# Patient Record
Sex: Male | Born: 1965 | Race: White | Hispanic: No | Marital: Married | Smoking: Never smoker
Health system: Southern US, Community
[De-identification: ages and names within clinical notes are randomized; demographics above are authoritative.]

## PROBLEM LIST (undated history)

## (undated) DIAGNOSIS — G8929 Other chronic pain: Secondary | ICD-10-CM

## (undated) DIAGNOSIS — R51 Headache: Secondary | ICD-10-CM

## (undated) DIAGNOSIS — R519 Headache, unspecified: Secondary | ICD-10-CM

## (undated) DIAGNOSIS — M722 Plantar fascial fibromatosis: Secondary | ICD-10-CM

## (undated) DIAGNOSIS — K760 Fatty (change of) liver, not elsewhere classified: Secondary | ICD-10-CM

## (undated) DIAGNOSIS — E785 Hyperlipidemia, unspecified: Secondary | ICD-10-CM

## (undated) DIAGNOSIS — M5030 Other cervical disc degeneration, unspecified cervical region: Secondary | ICD-10-CM

## (undated) DIAGNOSIS — R195 Other fecal abnormalities: Secondary | ICD-10-CM

## (undated) DIAGNOSIS — M51361 Other intervertebral disc degeneration, lumbar region with lower extremity pain only: Secondary | ICD-10-CM

## (undated) DIAGNOSIS — R270 Ataxia, unspecified: Secondary | ICD-10-CM

## (undated) HISTORY — DX: Other intervertebral disc degeneration, lumbar region with lower extremity pain only: M51.361

## (undated) HISTORY — DX: Headache: R51

## (undated) HISTORY — DX: Ataxia, unspecified: R27.0

## (undated) HISTORY — DX: Plantar fascial fibromatosis: M72.2

## (undated) HISTORY — DX: Fatty (change of) liver, not elsewhere classified: K76.0

## (undated) HISTORY — DX: Hyperlipidemia, unspecified: E78.5

## (undated) HISTORY — DX: Other chronic pain: G89.29

## (undated) HISTORY — DX: Other fecal abnormalities: R19.5

## (undated) HISTORY — DX: Headache, unspecified: R51.9

## (undated) HISTORY — DX: Other cervical disc degeneration, unspecified cervical region: M50.30

---

## 2006-06-18 ENCOUNTER — Emergency Department: Payer: Self-pay | Admitting: Emergency Medicine

## 2008-03-05 ENCOUNTER — Encounter: Payer: Self-pay | Admitting: Internal Medicine

## 2008-05-08 ENCOUNTER — Ambulatory Visit: Payer: Self-pay | Admitting: Internal Medicine

## 2008-05-08 DIAGNOSIS — K625 Hemorrhage of anus and rectum: Secondary | ICD-10-CM | POA: Insufficient documentation

## 2008-05-08 DIAGNOSIS — R1084 Generalized abdominal pain: Secondary | ICD-10-CM

## 2008-05-08 DIAGNOSIS — R198 Other specified symptoms and signs involving the digestive system and abdomen: Secondary | ICD-10-CM

## 2008-06-25 ENCOUNTER — Ambulatory Visit: Payer: Self-pay | Admitting: Internal Medicine

## 2008-10-14 ENCOUNTER — Ambulatory Visit: Payer: Self-pay | Admitting: Otolaryngology

## 2012-08-03 ENCOUNTER — Ambulatory Visit
Admission: RE | Admit: 2012-08-03 | Discharge: 2012-08-03 | Disposition: A | Discharge status: Home / Self Care | Payer: No Typology Code available for payment source | Source: Ambulatory Visit | Attending: Family Medicine | Admitting: Family Medicine

## 2012-08-03 ENCOUNTER — Other Ambulatory Visit: Payer: Self-pay | Admitting: Family Medicine

## 2012-08-03 DIAGNOSIS — Z021 Encounter for pre-employment examination: Secondary | ICD-10-CM

## 2015-07-23 ENCOUNTER — Encounter: Payer: Self-pay | Admitting: Internal Medicine

## 2018-02-13 ENCOUNTER — Encounter: Payer: Self-pay | Admitting: Physician Assistant

## 2018-02-23 ENCOUNTER — Ambulatory Visit: Payer: Self-pay | Admitting: Physician Assistant

## 2018-02-24 ENCOUNTER — Ambulatory Visit (INDEPENDENT_AMBULATORY_CARE_PROVIDER_SITE_OTHER): Payer: 59 | Admitting: Physician Assistant

## 2018-02-24 ENCOUNTER — Encounter: Payer: Self-pay | Admitting: Physician Assistant

## 2018-02-24 VITALS — BP 130/82 | HR 79 | Ht 71.0 in | Wt 179.0 lb

## 2018-02-24 DIAGNOSIS — R195 Other fecal abnormalities: Secondary | ICD-10-CM

## 2018-02-24 MED ORDER — PEG-KCL-NACL-NASULF-NA ASC-C 140 G PO SOLR: 1.0000 | ORAL | 0 refills | Status: DC

## 2018-02-24 NOTE — Progress Notes (Signed)
Chief Complaint: Hemoccult positive stools  HPI:    Brady Alexander is a 52 year old Caucasian male with a past medical history as listed below, who was referred to me by Burnard Bunting, MD for a complaint of occult positive stools.      Last colonoscopy 06/25/08 with Dr. Henrene Pastor.  Normal.  Repeat recommended in 10 years.    Office visit with PCP 01/11/18 for annual physical.  CBC, CMP normal.  Hemoccult positive.   Today, explains he has felt well recently.  Was taking a daily baby aspirin "per my own doing" and read that this could cause some GI bleeding, so stopped this over the past 5 days.  Tells me he has noticed a slight lightening of his stools, though denies black stool prior to this.  Has never noticed any bright red blood in his stool.  No change in bowel habits other than "maybe a little looser over the past month or two depending on what I eat".    Denies fever, chills, weight loss, anorexia, nausea, vomiting, heartburn, reflux, abdominal pain, rectal pain or family history of colon cancer.  Past Medical History:  Diagnosis Date  . Ataxia   . Chronic headaches   . Fatty liver     Current Outpatient Medications  Medication Sig Dispense Refill  . b complex vitamins tablet Take 1 tablet by mouth daily.    . Cyanocobalamin (VITAMIN B-12 PO) Take by mouth.    . Magnesium 250 MG TABS Take by mouth.    . Multiple Vitamins-Minerals (MULTIVITAMIN MEN PO) Take by mouth.    Marland Kitchen OVER THE COUNTER MEDICATION Tumeric once daily    . vitamin C (ASCORBIC ACID) 500 MG tablet Take 500 mg by mouth daily.    Marland Kitchen aspirin EC 81 MG tablet Take 81 mg by mouth daily.    Marland Kitchen PEG-KCl-NaCl-NaSulf-Na Asc-C (PLENVU) 140 g SOLR Take 1 kit by mouth as directed. 1 each 0   No current facility-administered medications for this visit.     Allergies as of 02/24/2018  . (No Known Allergies)    Family History  Problem Relation Age of Onset  . Diabetes Mother   . Hypertension Brother   . Obesity Brother   .  Alcoholism Brother     Social History   Socioeconomic History  . Marital status: Married    Spouse name: Not on file  . Number of children: Not on file  . Years of education: Not on file  . Highest education level: Not on file  Social Needs  . Financial resource strain: Not on file  . Food insecurity - worry: Not on file  . Food insecurity - inability: Not on file  . Transportation needs - medical: Not on file  . Transportation needs - non-medical: Not on file  Occupational History  . Occupation: Geophysicist/field seismologist  Tobacco Use  . Smoking status: Never Smoker  . Smokeless tobacco: Never Used  Substance and Sexual Activity  . Alcohol use: Yes  . Drug use: Not on file  . Sexual activity: Not on file  Other Topics Concern  . Not on file  Social History Narrative  . Not on file    Review of Systems:    Constitutional: No weight loss, fever or chills Skin: No rash  Cardiovascular: No chest pain Respiratory: No SOB Gastrointestinal: See HPI and otherwise negative Genitourinary: No dysuria  Neurological: No headache, dizziness or syncope Musculoskeletal: No new muscle or joint pain Hematologic: No bruising Psychiatric: No history  of depression or anxiety   Physical Exam:  Vital signs: BP 130/82   Pulse 79   Ht _0  (1.803 m)   Wt 179 lb (81.2 kg)   SpO2 99%   BMI 24.97 kg/m   Constitutional:   Pleasant Caucasian male appears to be in NAD, Well developed, Well nourished, alert and cooperative Head:  Normocephalic and atraumatic. Eyes:   PEERL, EOMI. No icterus. Conjunctiva pink. Ears:  Normal auditory acuity. Neck:  Supple Throat: Oral cavity and pharynx without inflammation, swelling or lesion.  Respiratory: Respirations even and unlabored. Lungs clear to auscultation bilaterally.   No wheezes, crackles, or rhonchi.  Cardiovascular: Normal S1, S2. No MRG. Regular rate and rhythm. No peripheral edema, cyanosis or pallor.  Gastrointestinal:  Soft, nondistended,  nontender. No rebound or guarding. Normal bowel sounds. No appreciable masses or hepatomegaly. Rectal:  Not performed.  Msk:  Symmetrical without gross deformities. Without edema, no deformity or joint abnormality.  Neurologic:  Alert and  oriented x4;  grossly normal neurologically.  Skin:   Dry and intact without significant lesions or rashes. Psychiatric: Demonstrates good judgement and reason without abnormal affect or behaviors.  See HPI for recent labs.  Assessment: 1.  Hemoccult positive stools: In the past 6 weeks 2 Hemoccult positive tests, patient has not been seeing any blood in his stools, no change in bowel habits or weight loss, last colonoscopy 10 years ago was normal; consider hemorrhoids versus polyp versus other  Plan: 1.  Scheduled patient for a diagnostic colonoscopy due to Hemoccult-positive stools with Dr. Henrene Pastor in [facility].  Did discuss risk, benefits, limitations and alternatives and patient agrees to proceed. 2.  Patient to follow in clinic per recommendations from Dr. Henrene Pastor after time of procedure.  Ellouise Newer, PA-C [facility] 02/24/2018, 9:46 AM  Cc: Burnard Bunting, MD

## 2018-02-24 NOTE — Patient Instructions (Signed)
If you are age 50 or older, your body mass index should be between 23-30. Your Body mass index is 24.97 kg/m. If this is out of the aforementioned range listed, please consider follow up with your Primary Care Provider.  If you are age 50 or younger, your body mass index should be between 19-25. Your Body mass index is 24.97 kg/m. If this is out of the aformentioned range listed, please consider follow up with your Primary Care Provider.   You have been scheduled for a colonoscopy. Please follow written instructions given to you at your visit today.  Please pick up your prep supplies at the pharmacy within the next 1-3 days. If you use inhalers (even only as needed), please bring them with you on the day of your procedure. Your physician has requested that you go to www.startemmi.com and enter the access code given to you at your visit today. This web site gives a general overview about your procedure. However, you should still follow specific instructions given to you by our office regarding your preparation for the procedure.  We have sent the following medications to your pharmacy for you to pick up at your convenience: Plenvu  Thank you for choosing me and [facility].  Hyacinth MeekerJennifer Lemmon, PA-C

## 2018-02-27 NOTE — Progress Notes (Signed)
Agree with assessment and plans 

## 2018-04-20 ENCOUNTER — Encounter: Payer: 59 | Admitting: Internal Medicine

## 2018-04-27 ENCOUNTER — Other Ambulatory Visit: Payer: Self-pay

## 2018-04-27 ENCOUNTER — Encounter: Payer: 59 | Admitting: Internal Medicine

## 2018-04-27 ENCOUNTER — Ambulatory Visit (AMBULATORY_SURGERY_CENTER): Payer: 59 | Admitting: Internal Medicine

## 2018-04-27 ENCOUNTER — Encounter: Payer: Self-pay | Admitting: Internal Medicine

## 2018-04-27 VITALS — BP 104/58 | HR 63 | Temp 98.6°F | Resp 17 | Ht 71.0 in | Wt 179.0 lb

## 2018-04-27 DIAGNOSIS — R195 Other fecal abnormalities: Secondary | ICD-10-CM | POA: Diagnosis present

## 2018-04-27 MED ORDER — SODIUM CHLORIDE 0.9 % IV SOLN: 500.0000 mL | Freq: Once | INTRAVENOUS | Status: AC

## 2018-04-27 NOTE — Progress Notes (Signed)
Report to PACU, RN, vss, BBS= Clear.  

## 2018-04-27 NOTE — Op Note (Signed)
[facility] Patient Name: Brady Alexander Procedure Date: 04/27/2018 3:03 PM MRN: [id number] Endoscopist: Wilhemina Bonito. Marina Goodell , MD Age: 52 Referring MD:  Date of Birth: 05/09/1966 Gender: Male Account #: [id number] Procedure:                Colonoscopy Indications:              Heme positive stool Medicines:                Monitored Anesthesia Care Procedure:                Pre-Anesthesia Assessment:                           - Prior to the procedure, a History and Physical                            was performed, and patient medications and                            allergies were reviewed. The patient's tolerance of                            previous anesthesia was also reviewed. The risks                            and benefits of the procedure and the sedation                            options and risks were discussed with the patient.                            All questions were answered, and informed consent                            was obtained. Prior Anticoagulants: The patient has                            taken no previous anticoagulant or antiplatelet                            agents. ASA Grade Assessment: I - A normal, healthy                            patient. After reviewing the risks and benefits,                            the patient was deemed in satisfactory condition to                            undergo the procedure.                           After obtaining informed consent, the colonoscope  was passed under direct vision. Throughout the                            procedure, the patient's blood pressure, pulse, and                            oxygen saturations were monitored continuously. The                            Model CF-HQ190L [id number]) scope was introduced                            through the anus and advanced to the the cecum,                            identified by appendiceal orifice and ileocecal                   valve. The ileocecal valve, appendiceal orifice,                            and rectum were photographed. The quality of the                            bowel preparation was excellent. The colonoscopy                            was performed without difficulty. The patient                            tolerated the procedure well. The bowel preparation                            used was SUPREP. Scope In: 3:24:17 PM Scope Out: 3:41:34 PM Scope Withdrawal Time: 0 hours 14 minutes 46 seconds  Total Procedure Duration: 0 hours 17 minutes 17 seconds  Findings:                 The entire examined colon appeared normal on direct                            and retroflexion views. Complications:            No immediate complications. Estimated blood loss:                            None. Estimated Blood Loss:     Estimated blood loss: none. Impression:               - The entire examined colon is normal on direct and                            retroflexion views.                           - No specimens collected. Recommendation:           -  Repeat colonoscopy in 10 years for screening                            purposes.                           - Patient has a contact number available for                            emergencies. The signs and symptoms of potential                            delayed complications were discussed with the                            patient. Return to normal activities tomorrow.                            Written discharge instructions were provided to the                            patient.                           - Resume previous diet.                           - Continue present medications. Wilhemina Bonito. Marina Goodell, MD 04/27/2018 3:47:01 PM This report has been signed electronically.

## 2018-04-27 NOTE — Progress Notes (Signed)
Pt's states no medical or surgical changes since previsit or office visit. 

## 2018-04-27 NOTE — Patient Instructions (Signed)
YOU HAD AN ENDOSCOPIC PROCEDURE TODAY AT THE [facility]:   Refer to the procedure report that was given to you for any specific questions about what was found during the examination.  If the procedure report does not answer your questions, please call your gastroenterologist to clarify.  If you requested that your care partner not be given the details of your procedure findings, then the procedure report has been included in a sealed envelope for you to review at your convenience later.  YOU SHOULD EXPECT: Some feelings of bloating in the abdomen. Passage of more gas than usual.  Walking can help get rid of the air that was put into your GI tract during the procedure and reduce the bloating. If you had a lower endoscopy (such as a colonoscopy or flexible sigmoidoscopy) you may notice spotting of blood in your stool or on the toilet paper. If you underwent a bowel prep for your procedure, you may not have a normal bowel movement for a few days.  Please Note:  You might notice some irritation and congestion in your nose or some drainage.  This is from the oxygen used during your procedure.  There is no need for concern and it should clear up in a day or so.  SYMPTOMS TO REPORT IMMEDIATELY:   Following lower endoscopy (colonoscopy or flexible sigmoidoscopy):  Excessive amounts of blood in the stool  Significant tenderness or worsening of abdominal pains  Swelling of the abdomen that is new, acute  Fever of 100F or higher   For urgent or emergent issues, a gastroenterologist can be reached at any hour by calling [phone].   DIET:  We do recommend a small meal at first, but then you may proceed to your regular diet.  Drink plenty of fluids but you should avoid alcoholic beverages for 24 hours.  ACTIVITY:  You should plan to take it easy for the rest of today and you should NOT DRIVE or use heavy machinery until tomorrow (because of the sedation medicines used during the test).     FOLLOW UP: Our staff will call the number listed on your records the next business day following your procedure to check on you and address any questions or concerns that you may have regarding the information given to you following your procedure. If we do not reach you, we will leave a message.  However, if you are feeling well and you are not experiencing any problems, there is no need to return our call.  We will assume that you have returned to your regular daily activities without incident.  If any biopsies were taken you will be contacted by phone or by letter within the next 1-3 weeks.  Please call us at [phone] if you have not heard about the biopsies in 3 weeks.    SIGNATURES/CONFIDENTIALITY: You and/or your care partner have signed paperwork which will be entered into your electronic medical record.  These signatures attest to the fact that that the information above on your After Visit Summary has been reviewed and is understood.  Full responsibility of the confidentiality of this discharge information lies with you and/or your care-partner.   Thank you for allowing us to provide your healthcare today.  

## 2018-04-28 ENCOUNTER — Telehealth: Payer: Self-pay | Admitting: *Deleted

## 2018-04-28 NOTE — Telephone Encounter (Signed)
  Follow up Call-  Call back number 04/27/2018  Post procedure Call Back phone  # [phone]  Permission to leave phone message Yes  Some recent data might be hidden     Patient questions:  Message left to call us if necessary.

## 2018-04-28 NOTE — Telephone Encounter (Signed)
No answer, message left for the patient. 

## 2019-06-14 ENCOUNTER — Encounter (HOSPITAL_COMMUNITY): Payer: 59

## 2019-09-05 ENCOUNTER — Other Ambulatory Visit: Payer: Self-pay

## 2019-09-05 DIAGNOSIS — Z20822 Contact with and (suspected) exposure to covid-19: Secondary | ICD-10-CM

## 2019-09-06 LAB — NOVEL CORONAVIRUS, NAA: SARS-CoV-2, NAA: NOT DETECTED

## 2019-09-11 ENCOUNTER — Other Ambulatory Visit: Payer: Self-pay

## 2019-09-11 DIAGNOSIS — Z20822 Contact with and (suspected) exposure to covid-19: Secondary | ICD-10-CM

## 2019-09-12 LAB — NOVEL CORONAVIRUS, NAA: SARS-CoV-2, NAA: NOT DETECTED

## 2019-10-18 ENCOUNTER — Other Ambulatory Visit: Payer: Self-pay

## 2019-10-18 DIAGNOSIS — Z20822 Contact with and (suspected) exposure to covid-19: Secondary | ICD-10-CM

## 2019-10-20 LAB — NOVEL CORONAVIRUS, NAA: SARS-CoV-2, NAA: NOT DETECTED

## 2019-12-29 ENCOUNTER — Other Ambulatory Visit: Payer: Self-pay

## 2019-12-29 ENCOUNTER — Encounter (HOSPITAL_COMMUNITY): Payer: Self-pay

## 2019-12-29 ENCOUNTER — Emergency Department (HOSPITAL_COMMUNITY)
Admission: EM | Admit: 2019-12-29 | Discharge: 2019-12-29 | Disposition: A | Discharge status: Home / Self Care | Payer: 59 | Attending: Emergency Medicine | Admitting: Emergency Medicine

## 2019-12-29 DIAGNOSIS — Y939 Activity, unspecified: Secondary | ICD-10-CM | POA: Diagnosis not present

## 2019-12-29 DIAGNOSIS — S91312A Laceration without foreign body, left foot, initial encounter: Secondary | ICD-10-CM | POA: Diagnosis not present

## 2019-12-29 DIAGNOSIS — Z23 Encounter for immunization: Secondary | ICD-10-CM | POA: Diagnosis not present

## 2019-12-29 DIAGNOSIS — Y288XXA Contact with other sharp object, undetermined intent, initial encounter: Secondary | ICD-10-CM | POA: Insufficient documentation

## 2019-12-29 DIAGNOSIS — Y999 Unspecified external cause status: Secondary | ICD-10-CM | POA: Diagnosis not present

## 2019-12-29 DIAGNOSIS — Y929 Unspecified place or not applicable: Secondary | ICD-10-CM | POA: Insufficient documentation

## 2019-12-29 MED ORDER — TETANUS-DIPHTH-ACELL PERTUSSIS 5-2.5-18.5 LF-MCG/0.5 IM SUSP
0.5000 mL | Freq: Once | INTRAMUSCULAR | Status: AC
  Administered 2019-12-29: 0.5 mL via INTRAMUSCULAR
  Filled 2019-12-29: qty 0.5

## 2019-12-29 MED ORDER — LIDOCAINE HCL (PF) 1 % IJ SOLN
5.0000 mL | Freq: Once | INTRAMUSCULAR | Status: AC
  Administered 2019-12-29: 5 mL via INTRADERMAL
  Filled 2019-12-29: qty 5

## 2019-12-29 NOTE — ED Notes (Signed)
Dried blood cleaned off the foot, nonstick gauze covered the stitches & then it was covered with a tegaderm.

## 2019-12-29 NOTE — Discharge Instructions (Addendum)
Have your stitches removed by a doctor in 10 days

## 2019-12-29 NOTE — ED Triage Notes (Signed)
Pt was jogging [place] steps [place] bridge & his Left heel caught against the wood on the step behind him & caused an approx. 5.5 cm long laceration.

## 2019-12-29 NOTE — ED Provider Notes (Signed)
[facility] Provider Note   CSN: [id number] Arrival date & time: 12/29/19  1403     History Chief Complaint  Patient presents with  . Foot Pain    Lequan Dobratz Corpuz is a 54 y.o. male.  HPI AMELIA MACKEN is a 54 y.o. male with a medical history of headache, fatty liver who presents to the ED for left ankle laceration. Injury occurred approximately two hours ago, he was running [place] steps on a bridge wearing sandles, his left heel caught the step behind him and sustained a 6 cm laceration to the back of his left heel. Wound is hemostatic. He denies any other injuries or pain. He has full strength in dorsi and plantar flexion of his left foot. No numbness of his foot.      Past Medical History:  Diagnosis Date  . Ataxia   . Chronic headaches   . Fatty liver     Patient Active Problem List   Diagnosis Date Noted  . RECTAL BLEEDING 05/08/2008  . CHANGE IN BOWELS 05/08/2008  . ABDOMINAL PAIN, GENERALIZED 05/08/2008    History reviewed. No pertinent surgical history.     Family History  Problem Relation Age of Onset  . Diabetes Mother   . Hypertension Brother   . Obesity Brother   . Alcoholism Brother   . Colon cancer Neg Hx   . Rectal cancer Neg Hx     Social History   Tobacco Use  . Smoking status: Never Smoker  . Smokeless tobacco: Never Used  Substance Use Topics  . Alcohol use: Yes  . Drug use: Never    Home Medications Prior to Admission medications   Medication Sig Start Date End Date Taking? Authorizing Provider  acetaminophen (TYLENOL) 325 MG tablet Take 325-650 mg by mouth every 6 (six) hours as needed for headache (pain).   Yes [provider]  Amino Acids (AMINO ACID PO) Take 1 tablet by mouth daily.   Yes [provider]  Ascorbic Acid (VITAMIN C) 1000 MG tablet Take 1,000 mg by mouth daily.    Yes [provider]  b complex vitamins tablet Take 1 tablet by mouth daily.   Yes [provider]  Magnesium 500 MG TABS Take 500 mg by mouth daily.    Yes [provider]  Multiple Vitamin (MULTIVITAMIN WITH MINERALS) TABS tablet Take 1 tablet by mouth daily.   Yes [provider]  Nicotinamide Riboside Chloride POWD Take 1 capsule by mouth daily.   Yes [provider]  Omega-3 Fatty Acids (FISH OIL PO) Take 1 capsule by mouth daily.   Yes [provider]  RESVERATROL PO Take 1 tablet by mouth daily.   Yes [provider]  TURMERIC PO Take 1 capsule by mouth daily.   Yes [provider]  vitamin B-12 (CYANOCOBALAMIN) 1000 MCG tablet Take 1,000 mcg by mouth daily.    Yes [provider]    Allergies    Patient has no known allergies.  Review of Systems   Review of Systems  Constitutional: Negative for chills and fever.  HENT: Negative for congestion, ear pain and sore throat.   Eyes: Negative for pain, discharge, redness and visual disturbance.  Respiratory: Negative for cough and shortness of breath.   Cardiovascular: Negative for chest pain and palpitations.  Gastrointestinal: Negative for abdominal pain, diarrhea, nausea and vomiting.  Genitourinary: Negative for dysuria, frequency and hematuria.  Musculoskeletal: Negative for arthralgias, back pain  and neck stiffness.  Skin: Positive for wound (Laceration to posterior left heel). Negative for color change and rash.  Neurological: Negative for seizures, syncope and weakness.  Psychiatric/Behavioral: Negative for agitation.  All other systems reviewed and are negative.   Physical Exam Updated Vital Signs BP 125/82 (BP Location: Right Arm)   Pulse 72   Temp 98.3 F (36.8 C) (Oral)   Resp 18   Ht 5\' 10"  (1.778 m)   Wt 79.4 kg   SpO2 100%   BMI 25.11 kg/m   Physical Exam Vitals and nursing note reviewed.  Constitutional:      General: He is not in acute distress.    Appearance: Normal appearance. He is well-developed. He is not  ill-appearing.  HENT:     Head: Normocephalic and atraumatic.     Right Ear: External ear normal.     Left Ear: External ear normal.     Nose: Nose normal. No congestion.     Mouth/Throat:     Mouth: Mucous membranes are moist.  Eyes:     General:        Right eye: No discharge.        Left eye: No discharge.     Conjunctiva/sclera: Conjunctivae normal.  Cardiovascular:     Rate and Rhythm: Normal rate and regular rhythm.     Pulses: Normal pulses.     Heart sounds: Normal heart sounds. No murmur.  Pulmonary:     Effort: Pulmonary effort is normal. No respiratory distress.     Breath sounds: Normal breath sounds. No wheezing or rales.  Abdominal:     General: Abdomen is flat. There is no distension.     Palpations: Abdomen is soft.     Tenderness: There is no abdominal tenderness.  Musculoskeletal:        General: No signs of injury. Normal range of motion.     Cervical back: Normal range of motion and neck supple.  Skin:    General: Skin is warm and dry.     Capillary Refill: Capillary refill takes less than 2 seconds.     Findings: Erythema and lesion present.     Comments: 6 cm linear laceration to posterior left heel at insertion site of his achilles tendon. Wound is hemostatic. DP pulses 2+ bilaterally, sensation intact to light touch. He has full strength of dorsi and plantar flexion, achilles tendon is intact  Neurological:     General: No focal deficit present.     Mental Status: He is alert. Mental status is at baseline.  Psychiatric:        Mood and Affect: Mood normal.        Behavior: Behavior normal.     ED Results / Procedures / Treatments   Labs (all labs ordered are listed, but only abnormal results are displayed) Labs Reviewed - No data to display  EKG None  Radiology No results found.  Procedures . Laceration Repair  Date/Time: 12/29/2019 3:52 PM Performed by: 02/26/2020, MD Authorized by: Manuela Schwartz, MD   Consent:    Consent  obtained:  Verbal   Consent given by:  Patient   Risks discussed:  Infection and pain   Alternatives discussed:  No treatment Universal protocol:    Patient identity confirmed:  Verbally with patient and arm band Anesthesia (see MAR for exact dosages):    Anesthesia method:  Local infiltration   Local anesthetic:  Lidocaine 1% w/o epi Laceration details:    Location:  Foot   Foot location:  L heel   Length (cm):  6 Repair type:    Repair type:  Simple Pre-procedure details:    Preparation:  Patient was prepped and draped in usual sterile fashion Exploration:    Wound exploration: entire depth of wound probed and visualized     Wound extent: no foreign bodies/material noted, no nerve damage noted and no tendon damage noted     Contaminated: no   Treatment:    Area cleansed with:  Saline   Amount of cleaning:  Extensive   Irrigation solution:  Sterile saline   Irrigation volume:  1000 mL   Irrigation method:  Pressure wash   Visualized foreign bodies/material removed: no   Skin repair:    Repair method:  Sutures   Suture size:  3-0   Suture material:  Nylon   Suture technique:  Horizontal mattress   Number of sutures:  6 Approximation:    Approximation:  Close Post-procedure details:    Dressing:  Non-adherent dressing   Patient tolerance of procedure:  Tolerated well, no immediate complications   (including critical care time)  Medications Ordered in ED Medications  lidocaine (PF) (XYLOCAINE) 1 % injection 5 mL (5 mLs Intradermal Given 12/29/19 1525)  Tdap (BOOSTRIX) injection 0.5 mL (0.5 mLs Intramuscular Given 12/29/19 1520)    ED Course  I have reviewed the triage vital signs and the nursing notes.  Pertinent labs & imaging results that were available during my care of the patient were reviewed by me and considered in my medical decision making (see chart for details).    MDM Rules/Calculators/A&P                      Eulalio Reamy Bachmann is a 54 y.o. male with a  medical history of headache, fatty liver who presents to the ED for left ankle laceration. Injury occurred approximately two hours ago, he was running [place] bridge wearing sandles, his left heel caught the step behind him and sustained a 6 cm laceration to the back of his left heel. Wound is hemostatic. He denies any other injuries or pain. He has full strength in dorsi and plantar flexion of his left foot. No numbness of his foot.  HPI and physical exam as above. He presents awake, alert, hemodynamically stable, afebrile, non toxic. 6 cm linear laceration to posterior left heel at insertion site of his achilles tendon. Wound is hemostatic. DP pulses 2+ bilaterally, sensation intact to light touch. He has full strength of dorsi and plantar flexion, achilles tendon is intact  See procedure note, repaired by primary intention without complication. Tdap updated. Wound care discussed and instructed for suture removal in ten days by a provider. Discussed plan with patient and family including strict return precautions and follow up with PCP. Patient and family understand and are amenable with plan.   Final Clinical Impression(s) / ED Diagnoses Final diagnoses:  Laceration of left foot, initial encounter    Rx / DC Orders ED Discharge Orders    None       Mija Effertz, Ladona Ridgel, MD 12/29/19 1840    Little, Ambrose Finland, MD 12/29/19 1952

## 2019-12-29 NOTE — ED Notes (Signed)
MD at bedside numbing pt's foot.

## 2020-02-12 ENCOUNTER — Ambulatory Visit: Payer: 59 | Attending: Internal Medicine

## 2020-02-12 DIAGNOSIS — Z20822 Contact with and (suspected) exposure to covid-19: Secondary | ICD-10-CM

## 2020-02-13 LAB — NOVEL CORONAVIRUS, NAA: SARS-CoV-2, NAA: NOT DETECTED

## 2020-11-20 ENCOUNTER — Other Ambulatory Visit: Payer: Self-pay

## 2020-11-20 ENCOUNTER — Other Ambulatory Visit
Admission: RE | Admit: 2020-11-20 | Discharge: 2020-11-20 | Disposition: A | Discharge status: Home / Self Care | Payer: 59 | Source: Ambulatory Visit | Attending: Orthopedic Surgery | Admitting: Orthopedic Surgery

## 2020-11-20 ENCOUNTER — Encounter (HOSPITAL_COMMUNITY): Payer: Self-pay | Admitting: Orthopedic Surgery

## 2020-11-20 DIAGNOSIS — Z01812 Encounter for preprocedural laboratory examination: Secondary | ICD-10-CM | POA: Insufficient documentation

## 2020-11-20 DIAGNOSIS — Z20822 Contact with and (suspected) exposure to covid-19: Secondary | ICD-10-CM | POA: Diagnosis not present

## 2020-11-20 LAB — SARS CORONAVIRUS 2 (TAT 6-24 HRS): SARS Coronavirus 2: NEGATIVE

## 2020-11-20 NOTE — Progress Notes (Signed)
PCP - Dr. Geoffry Paradise  Cardiologist - denies  Chest x-ray - n/a EKG - n/a Stress Test - denies ECHO - deneis Cardiac Cath - denies  ERAS Protcol - clears 0630  COVID TEST- 11/20/20  Coronavirus Screening  Have you experienced the following symptoms:  Cough yes/no: No Fever (>100.38F)  yes/no: No Runny nose yes/no: No Sore throat yes/no: No Difficulty breathing/shortness of breath  yes/no: No  Have you or a family member traveled in the last 14 days and where? yes/no: No  Anesthesia review: n/a   -------------  SDW INSTRUCTIONS:  Your procedure is scheduled on Saturday 11/22/20.  Report to [facility] Emergency Department at 0700 A.M., and check in.  Call this number if you have problems the morning of surgery: [phone]   Remember: Do not eat after midnight the night before your surgery  You may drink clear liquids until 06:30 AM the morning of your surgery.   Clear liquids allowed are: Water, Non-Citrus Juices (without pulp), Carbonated Beverages, Clear Tea, Black Coffee Only, and Gatorade   No medications needed morning of surgery.  Per Dr. Amanda Pea - no abx d/t cx interop.  As of today, STOP taking any Aspirin (unless otherwise instructed by your surgeon), Aleve, Naproxen, Ibuprofen, Motrin, Advil, Goody's, BC's, all herbal medications, fish oil, and all vitamins.    The Morning of Surgery  Do not wear jewelry  Do not wear lotions, powders, colognes, or deodorant Men may shave face and neck.  Do not bring valuables to the hospital.  [facility] is not responsible for any belongings or valuables.  If you are a smoker, DO NOT Smoke 24 hours prior to surgery  If you wear a CPAP at night please bring your mask the morning of surgery   Remember that you must have someone to transport you home after your surgery, and remain with you for 24 hours if you are discharged the same day.  Please bring cases for contacts, glasses, hearing aids, dentures or  bridgework because it cannot be worn into surgery.   Leave your suitcase in the car.  After surgery it may be brought to your room.  For patients admitted to the hospital, discharge time will be determined by your treatment team.  Patients discharged the day of surgery will not be allowed to drive home.    Special instructions:   [facility]- Preparing For Surgery  Oral Hygiene is also important to reduce your risk of infection.  Remember - BRUSH YOUR TEETH THE MORNING OF SURGERY WITH YOUR REGULAR TOOTHPASTE  Please follow these instructions carefully.   1. Shower the NIGHT BEFORE SURGERY and the MORNING OF SURGERY with DIAL Soap.   2. Wash thoroughly, paying special attention to the area where your surgery will be performed.  3. Thoroughly rinse your body with warm water from the neck down.  4. Pat yourself dry with a CLEAN TOWEL.  5. Wear CLEAN PAJAMAS to bed the night before surgery  6. Place CLEAN SHEETS on your bed the night of your first shower and DO NOT SLEEP WITH PETS.  7. Wear comfortable clothes the morning of surgery.    Day of Surgery:  Please shower the morning of surgery with the DIAL soap Do not apply any deodorants/lotions. Please wear clean clothes to the hospital/surgery center.   Remember to brush your teeth WITH YOUR REGULAR TOOTHPASTE.   Please read over the following fact sheets that you were given.  Patient denies shortness of breath, fever,  cough and chest pain.

## 2020-11-22 ENCOUNTER — Ambulatory Visit (HOSPITAL_COMMUNITY): Payer: 59 | Admitting: Anesthesiology

## 2020-11-22 ENCOUNTER — Ambulatory Visit (HOSPITAL_COMMUNITY)
Admission: RE | Admit: 2020-11-22 | Discharge: 2020-11-22 | Disposition: A | Discharge status: Home / Self Care | Payer: 59 | Attending: Orthopedic Surgery | Admitting: Orthopedic Surgery

## 2020-11-22 ENCOUNTER — Encounter (HOSPITAL_COMMUNITY)
Admission: RE | Disposition: A | Discharge status: Home / Self Care | Payer: Self-pay | Source: Home / Self Care | Attending: Orthopedic Surgery

## 2020-11-22 ENCOUNTER — Encounter (HOSPITAL_COMMUNITY): Payer: Self-pay | Admitting: Orthopedic Surgery

## 2020-11-22 DIAGNOSIS — M79645 Pain in left finger(s): Secondary | ICD-10-CM | POA: Diagnosis not present

## 2020-11-22 DIAGNOSIS — M6588 Other synovitis and tenosynovitis, other site: Secondary | ICD-10-CM | POA: Diagnosis present

## 2020-11-22 DIAGNOSIS — Z79899 Other long term (current) drug therapy: Secondary | ICD-10-CM | POA: Insufficient documentation

## 2020-11-22 HISTORY — PX: TENDON REPAIR: SHX5111

## 2020-11-22 SURGERY — TENDON REPAIR
Anesthesia: General | Laterality: Left

## 2020-11-22 MED ORDER — OXYCODONE HCL 5 MG PO TABS: 5.0000 mg | ORAL_TABLET | Freq: Once | ORAL | Status: DC | PRN

## 2020-11-22 MED ORDER — SUFENTANIL CITRATE 50 MCG/ML IV SOLN
INTRAVENOUS | Status: AC
  Filled 2020-11-22: qty 1

## 2020-11-22 MED ORDER — CLINDAMYCIN PHOSPHATE 600 MG/50ML IV SOLN
INTRAVENOUS | Status: AC
  Filled 2020-11-22: qty 50

## 2020-11-22 MED ORDER — PROMETHAZINE HCL 25 MG/ML IJ SOLN: 6.2500 mg | INTRAMUSCULAR | Status: DC | PRN

## 2020-11-22 MED ORDER — KETOROLAC TROMETHAMINE 30 MG/ML IJ SOLN
30.0000 mg | Freq: Once | INTRAMUSCULAR | Status: AC
  Administered 2020-11-22: 30 mg via INTRAVENOUS

## 2020-11-22 MED ORDER — EPHEDRINE 5 MG/ML INJ
INTRAVENOUS | Status: AC
  Filled 2020-11-22: qty 10

## 2020-11-22 MED ORDER — SODIUM CHLORIDE (PF) 0.9 % IJ SOLN
INTRAMUSCULAR | Status: AC
  Filled 2020-11-22: qty 10

## 2020-11-22 MED ORDER — 0.9 % SODIUM CHLORIDE (POUR BTL) OPTIME
TOPICAL | Status: DC | PRN
  Administered 2020-11-22: 1000 mL

## 2020-11-22 MED ORDER — DEXAMETHASONE SODIUM PHOSPHATE 10 MG/ML IJ SOLN
INTRAMUSCULAR | Status: DC | PRN
  Administered 2020-11-22: 5 mg via INTRAVENOUS

## 2020-11-22 MED ORDER — LIDOCAINE 2% (20 MG/ML) 5 ML SYRINGE
INTRAMUSCULAR | Status: DC | PRN
  Administered 2020-11-22: 60 mg via INTRAVENOUS

## 2020-11-22 MED ORDER — ORAL CARE MOUTH RINSE: 15.0000 mL | Freq: Once | OROMUCOSAL | Status: AC

## 2020-11-22 MED ORDER — MIDAZOLAM HCL 2 MG/2ML IJ SOLN
INTRAMUSCULAR | Status: AC
  Filled 2020-11-22: qty 2

## 2020-11-22 MED ORDER — OXYCODONE HCL 5 MG/5ML PO SOLN: 5.0000 mg | Freq: Once | ORAL | Status: DC | PRN

## 2020-11-22 MED ORDER — ONDANSETRON HCL 4 MG/2ML IJ SOLN
INTRAMUSCULAR | Status: DC | PRN
  Administered 2020-11-22: 4 mg via INTRAVENOUS

## 2020-11-22 MED ORDER — EPHEDRINE SULFATE-NACL 50-0.9 MG/10ML-% IV SOSY
PREFILLED_SYRINGE | INTRAVENOUS | Status: DC | PRN
  Administered 2020-11-22: 5 mg via INTRAVENOUS

## 2020-11-22 MED ORDER — PROPOFOL 10 MG/ML IV BOLUS
INTRAVENOUS | Status: AC
  Filled 2020-11-22: qty 20

## 2020-11-22 MED ORDER — FENTANYL CITRATE (PF) 100 MCG/2ML IJ SOLN
INTRAMUSCULAR | Status: AC
  Filled 2020-11-22: qty 2

## 2020-11-22 MED ORDER — MIDAZOLAM HCL 2 MG/2ML IJ SOLN
INTRAMUSCULAR | Status: DC | PRN
  Administered 2020-11-22: 2 mg via INTRAVENOUS

## 2020-11-22 MED ORDER — CHLORHEXIDINE GLUCONATE 0.12 % MT SOLN: 15.0000 mL | Freq: Once | OROMUCOSAL | Status: AC

## 2020-11-22 MED ORDER — LIDOCAINE HCL (PF) 2 % IJ SOLN
INTRAMUSCULAR | Status: AC
  Filled 2020-11-22: qty 10

## 2020-11-22 MED ORDER — CLINDAMYCIN PHOSPHATE 600 MG/50ML IV SOLN
600.0000 mg | Freq: Once | INTRAVENOUS | Status: AC
  Administered 2020-11-22: 600 mg via INTRAVENOUS

## 2020-11-22 MED ORDER — FENTANYL CITRATE (PF) 100 MCG/2ML IJ SOLN
25.0000 ug | INTRAMUSCULAR | Status: DC | PRN
  Administered 2020-11-22: 25 ug via INTRAVENOUS

## 2020-11-22 MED ORDER — LACTATED RINGERS IV SOLN: INTRAVENOUS | Status: DC

## 2020-11-22 MED ORDER — SUFENTANIL CITRATE 50 MCG/ML IV SOLN
INTRAVENOUS | Status: DC | PRN
Start: 2020-11-22 — End: 2020-11-22
  Administered 2020-11-22 (×3): 5 ug via INTRAVENOUS

## 2020-11-22 MED ORDER — KETOROLAC TROMETHAMINE 30 MG/ML IJ SOLN
INTRAMUSCULAR | Status: AC
  Filled 2020-11-22: qty 1

## 2020-11-22 MED ORDER — CHLORHEXIDINE GLUCONATE 0.12 % MT SOLN
OROMUCOSAL | Status: AC
  Administered 2020-11-22: 15 mL via OROMUCOSAL
  Filled 2020-11-22: qty 15

## 2020-11-22 MED ORDER — PROPOFOL 10 MG/ML IV BOLUS
INTRAVENOUS | Status: DC | PRN
  Administered 2020-11-22: 200 mg via INTRAVENOUS

## 2020-11-22 MED ORDER — ACETAMINOPHEN 10 MG/ML IV SOLN: 1000.0000 mg | Freq: Once | INTRAVENOUS | Status: DC | PRN

## 2020-11-22 SURGICAL SUPPLY — 54 items
BNDG COHESIVE 1X5 TAN STRL LF (GAUZE/BANDAGES/DRESSINGS) IMPLANT
BNDG ELASTIC 3X5.8 VLCR STR LF (GAUZE/BANDAGES/DRESSINGS) IMPLANT
BNDG ELASTIC 4X5.8 VLCR STR LF (GAUZE/BANDAGES/DRESSINGS) ×3 IMPLANT
BNDG GAUZE ELAST 4 BULKY (GAUZE/BANDAGES/DRESSINGS) ×3 IMPLANT
CORD BIPOLAR FORCEPS 12FT (ELECTRODE) ×3 IMPLANT
COVER SURGICAL LIGHT HANDLE (MISCELLANEOUS) ×3 IMPLANT
COVER WAND RF STERILE (DRAPES) ×3 IMPLANT
CUFF TOURN SGL QUICK 18X4 (TOURNIQUET CUFF) ×3 IMPLANT
CUFF TOURN SGL QUICK 24 (TOURNIQUET CUFF)
CUFF TRNQT CYL 24X4X16.5-23 (TOURNIQUET CUFF) IMPLANT
DECANTER SPIKE VIAL GLASS SM (MISCELLANEOUS) IMPLANT
DRAPE SURG 17X23 STRL (DRAPES) ×3 IMPLANT
DRSG ADAPTIC 3X8 NADH LF (GAUZE/BANDAGES/DRESSINGS) ×3 IMPLANT
GAUZE SPONGE 2X2 8PLY STRL LF (GAUZE/BANDAGES/DRESSINGS) IMPLANT
GAUZE SPONGE 4X4 12PLY STRL (GAUZE/BANDAGES/DRESSINGS) IMPLANT
GAUZE XEROFORM 1X8 LF (GAUZE/BANDAGES/DRESSINGS) IMPLANT
GAUZE XEROFORM 5X9 LF (GAUZE/BANDAGES/DRESSINGS) ×3 IMPLANT
GLOVE BIOGEL M 8.0 STRL (GLOVE) ×3 IMPLANT
GLOVE SS BIOGEL STRL SZ 8 (GLOVE) ×1 IMPLANT
GLOVE SUPERSENSE BIOGEL SZ 8 (GLOVE) ×2
GOWN STRL REUS W/ TWL LRG LVL3 (GOWN DISPOSABLE) ×2 IMPLANT
GOWN STRL REUS W/ TWL XL LVL3 (GOWN DISPOSABLE) ×3 IMPLANT
GOWN STRL REUS W/TWL LRG LVL3 (GOWN DISPOSABLE) ×6
GOWN STRL REUS W/TWL XL LVL3 (GOWN DISPOSABLE) ×9
KIT BASIN OR (CUSTOM PROCEDURE TRAY) ×3 IMPLANT
KIT TURNOVER KIT B (KITS) ×3 IMPLANT
MANIFOLD NEPTUNE II (INSTRUMENTS) IMPLANT
NEEDLE HYPO 25GX1X1/2 BEV (NEEDLE) IMPLANT
NS IRRIG 1000ML POUR BTL (IV SOLUTION) ×3 IMPLANT
PACK ORTHO EXTREMITY (CUSTOM PROCEDURE TRAY) ×3 IMPLANT
PAD ARMBOARD 7.5X6 YLW CONV (MISCELLANEOUS) ×6 IMPLANT
PAD CAST 3X4 CTTN HI CHSV (CAST SUPPLIES) ×1 IMPLANT
PAD CAST 4YDX4 CTTN HI CHSV (CAST SUPPLIES) ×1 IMPLANT
PADDING CAST COTTON 3X4 STRL (CAST SUPPLIES) ×3
PADDING CAST COTTON 4X4 STRL (CAST SUPPLIES) ×3
SOL PREP POV-IOD 4OZ 10% (MISCELLANEOUS) ×3 IMPLANT
SPECIMEN JAR SMALL (MISCELLANEOUS) ×3 IMPLANT
SPLINT FIBERGLASS 3X12 (CAST SUPPLIES) ×3 IMPLANT
SPONGE GAUZE 2X2 STER 10/PKG (GAUZE/BANDAGES/DRESSINGS)
SUCTION FRAZIER HANDLE 10FR (MISCELLANEOUS)
SUCTION TUBE FRAZIER 10FR DISP (MISCELLANEOUS) IMPLANT
SUT MERSILENE 4 0 P 3 (SUTURE) IMPLANT
SUT PROLENE 4 0 PS 2 18 (SUTURE) ×3 IMPLANT
SUT VIC AB 2-0 CT1 27 (SUTURE)
SUT VIC AB 2-0 CT1 TAPERPNT 27 (SUTURE) IMPLANT
SWAB COLLECTION DEVICE MRSA (MISCELLANEOUS) ×6 IMPLANT
SWAB CULTURE ESWAB REG 1ML (MISCELLANEOUS) ×6 IMPLANT
SYR CONTROL 10ML LL (SYRINGE) IMPLANT
TOWEL GREEN STERILE (TOWEL DISPOSABLE) ×3 IMPLANT
TOWEL GREEN STERILE FF (TOWEL DISPOSABLE) ×3 IMPLANT
TUBE CONNECTING 12'X1/4 (SUCTIONS)
TUBE CONNECTING 12X1/4 (SUCTIONS) IMPLANT
UNDERPAD 30X36 HEAVY ABSORB (UNDERPADS AND DIAPERS) ×3 IMPLANT
WATER STERILE IRR 1000ML POUR (IV SOLUTION) IMPLANT
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
## 2020-11-22 NOTE — Anesthesia Postprocedure Evaluation (Signed)
Anesthesia Post Note  Patient: Brady Alexander  Procedure(s) Performed: Left ring finger open tendon synovectomy with exploration including biopsy and culture (Left )     Patient location during evaluation: PACU Anesthesia Type: General Level of consciousness: awake and alert Pain management: pain level controlled Vital Signs Assessment: post-procedure vital signs reviewed and stable Respiratory status: spontaneous breathing, nonlabored ventilation, respiratory function stable and patient connected to nasal cannula oxygen Cardiovascular status: blood pressure returned to baseline and stable Postop Assessment: no apparent nausea or vomiting Anesthetic complications: no   No complications documented.  Last Vitals:  Vitals:   11/22/20 1150 11/22/20 1205  BP: 140/81 (!) 161/92  Pulse: (!) 52 62  Resp: 17 18  Temp:  (!) 36.3 C  SpO2: 98% 98%    Last Pain:  Vitals:   11/22/20 1205  TempSrc:   PainSc: 2                  Caya Soberanis P Jaan Fischel

## 2020-11-22 NOTE — Anesthesia Procedure Notes (Addendum)
Procedure Name: LMA Insertion Date/Time: 11/22/2020 9:34 AM Performed by: Aundria Rud, CRNA Pre-anesthesia Checklist: Patient identified, Emergency Drugs available, Suction available and Patient being monitored Patient Re-evaluated:Patient Re-evaluated prior to induction Oxygen Delivery Method: Circle System Utilized and Circle system utilized Preoxygenation: Pre-oxygenation with 100% oxygen Induction Type: IV induction Ventilation: Mask ventilation without difficulty LMA: LMA inserted LMA Size: 4.0 Number of attempts: 1 Placement Confirmation: positive ETCO2 Tube secured with: Tape Dental Injury: Teeth and Oropharynx as per pre-operative assessment

## 2020-11-22 NOTE — Discharge Instructions (Signed)
Please elevate move massage her fingers.  I have called in prescriptions for you to your pharmacy.  1 is a pain medicine.  Second is a nausea medicine in case the pain medicine or antibiotics cause any nausea.  It is okay to move your fingers within the confines of the splint.  Please elevate and expect some degree of pain after the surgery.  Please notify should any problems occur.  I will phone in antibiotics to begin today.  This is the doxycycline which she had been on previously.  We recommend that you to take vitamin C 1000 mg a day to promote healing. We also recommend that if you require  pain medicine that you take a stool softener to prevent constipation as most pain medicines will have constipation side effects. We recommend either Peri-Colace or Senokot and recommend that you also consider adding MiraLAX as well to prevent the constipation affects from pain medicine if you are required to use them. These medicines are over the counter and may be purchased at a local pharmacy. A cup of yogurt and a probiotic can also be helpful during the recovery process as the medicines can disrupt your intestinal environment. Keep bandage clean and dry.  Call for any problems.  No smoking.  Criteria for driving a car: you should be off your pain medicine for 7-8 hours, able to drive one handed(confident), thinking clearly and feeling able in your judgement to drive. Continue elevation as it will decrease swelling.  If instructed by MD move your fingers within the confines of the bandage/splint.  Use ice if instructed by your MD. Call immediately for any sudden loss of feeling in your hand/arm or change in functional abilities of the extremity.

## 2020-11-22 NOTE — Anesthesia Preprocedure Evaluation (Addendum)
Anesthesia Evaluation  Patient identified by MRN, date of birth, ID band Patient awake    Reviewed: Allergy & Precautions, NPO status , Patient's Chart, lab work & pertinent test results  Airway Mallampati: III  TM Distance: >3 FB Neck ROM: Full    Dental no notable dental hx.    Pulmonary neg pulmonary ROS,    Pulmonary exam normal breath sounds clear to auscultation       Cardiovascular negative cardio ROS Normal cardiovascular exam Rhythm:Regular Rate:Normal     Neuro/Psych  Headaches,    GI/Hepatic negative GI ROS, Fatty liver   Endo/Other  negative endocrine ROS  Renal/GU negative Renal ROS     Musculoskeletal negative musculoskeletal ROS (+)   Abdominal   Peds  Hematology negative hematology ROS (+)   Anesthesia Other Findings Chronic left ring finger pain swelling and tenosynovitis  Reproductive/Obstetrics                            Anesthesia Physical Anesthesia Plan  ASA: I  Anesthesia Plan: General   Post-op Pain Management:    Induction: Intravenous  PONV Risk Score and Plan: 2 and Ondansetron, Dexamethasone, Midazolam and Treatment may vary due to age or medical condition  Airway Management Planned: LMA  Additional Equipment:   Intra-op Plan:   Post-operative Plan: Extubation in OR  Informed Consent: I have reviewed the patients History and Physical, chart, labs and discussed the procedure including the risks, benefits and alternatives for the proposed anesthesia with the patient or authorized representative who has indicated his/her understanding and acceptance.     Dental advisory given  Plan Discussed with: CRNA  Anesthesia Plan Comments:         Anesthesia Quick Evaluation

## 2020-11-22 NOTE — Transfer of Care (Signed)
Immediate Anesthesia Transfer of Care Note  Patient: Brady Alexander  Procedure(s) Performed: Left ring finger open tendon synovectomy with exploration including biopsy and culture (Left )  Patient Location: PACU  Anesthesia Type:General  Level of Consciousness: drowsy and patient cooperative  Airway & Oxygen Therapy: Patient Spontanous Breathing and Patient connected to face mask oxygen  Post-op Assessment: Report given to RN, Post -op Vital signs reviewed and stable and Patient moving all extremities  Post vital signs: Reviewed and stable  Last Vitals:  Vitals Value Taken Time  BP 149/91 11/22/20 1105  Temp    Pulse 59 11/22/20 1106  Resp 15 11/22/20 1106  SpO2 100 % 11/22/20 1106  Vitals shown include unvalidated device data.  Last Pain:  Vitals:   11/22/20 0749  TempSrc:   PainSc: 0-No pain      Patients Stated Pain Goal: 4 (11/22/20 0749)  Complications: No complications documented.

## 2020-11-23 ENCOUNTER — Encounter (HOSPITAL_COMMUNITY): Payer: Self-pay | Admitting: Orthopedic Surgery

## 2020-11-23 LAB — SARS CORONAVIRUS 2 (TAT 6-24 HRS): SARS Coronavirus 2: NEGATIVE

## 2020-11-24 LAB — ACID FAST SMEAR (AFB, MYCOBACTERIA): Acid Fast Smear: NEGATIVE

## 2020-11-25 ENCOUNTER — Telehealth: Payer: Self-pay | Admitting: Infectious Disease

## 2020-11-25 LAB — ACID FAST SMEAR (AFB, MYCOBACTERIA): Acid Fast Smear: NEGATIVE

## 2020-11-25 NOTE — Telephone Encounter (Signed)
Would be best if pt seen this week

## 2020-11-25 NOTE — Telephone Encounter (Signed)
Brady Alexander can you please reach out to patient for scheduling. Thanks
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
## 2020-11-26 ENCOUNTER — Other Ambulatory Visit: Payer: Self-pay

## 2020-11-26 ENCOUNTER — Ambulatory Visit (INDEPENDENT_AMBULATORY_CARE_PROVIDER_SITE_OTHER): Payer: 59 | Admitting: Internal Medicine

## 2020-11-26 ENCOUNTER — Encounter: Payer: Self-pay | Admitting: Internal Medicine

## 2020-11-26 VITALS — BP 127/67 | HR 58 | Temp 97.9°F | Resp 16 | Ht 70.0 in | Wt 178.0 lb

## 2020-11-26 DIAGNOSIS — Z23 Encounter for immunization: Secondary | ICD-10-CM

## 2020-11-26 DIAGNOSIS — M659 Synovitis and tenosynovitis, unspecified: Secondary | ICD-10-CM | POA: Diagnosis not present

## 2020-11-26 MED ORDER — ETHAMBUTOL HCL 400 MG PO TABS: 1200.0000 mg | ORAL_TABLET | Freq: Every day | ORAL | 1 refills | Status: AC

## 2020-11-26 MED ORDER — AZITHROMYCIN 250 MG PO TABS: 250.0000 mg | ORAL_TABLET | Freq: Every day | ORAL | 1 refills | Status: DC

## 2020-11-26 MED ORDER — RIFAMPIN 300 MG PO CAPS: 600.0000 mg | ORAL_CAPSULE | Freq: Every day | ORAL | 1 refills | Status: AC

## 2020-11-26 NOTE — Progress Notes (Signed)
[facility] for Infectious Disease  Patient Active Problem List   Diagnosis Date Noted  . RECTAL BLEEDING 05/08/2008  . CHANGE IN BOWELS 05/08/2008  . ABDOMINAL PAIN, GENERALIZED 05/08/2008      Subjective:    Patient ID: Brady Alexander, male    DOB: 1966/03/02, 54 y.o.   MRN: [id number]  Chief Complaint  Patient presents with  . New Patient (Initial Visit)    L hand 4th digit injury seeing Ortho- injury is causing Myobacterial inf. Surgical pathology done on Saturday.     HPI:  Brady Alexander is a 54 y.o. male   Late 09/2020 went diving/snorkling in [place]. One day he didn't use glove and touch left index finger on a coral reef. Within a few days noticed a skin colored bump. Gradually had swelling and mild pain and some redness. Denies touching any fish but did a sea cucumber being eating by a conch   Initially given doxycycline, then cipro added for 2 more weeks  A week ago given no change but progression, surgery was done by dr Amedeo Plenty. 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 Again no fever/chill. Swelling had much improved He is still on doxy/cipro  The path show chronic inflammation with reactive changes The afb stain is negative and cx is still in progress from 12/4   No Known Allergies    Outpatient Medications Prior to Visit  Medication Sig Dispense Refill  . acetaminophen (TYLENOL) 325 MG tablet Take 325-650 mg by mouth every 6 (six) hours as needed for headache (pain).    . Amino Acids (AMINO ACID PO) Take 1 tablet by mouth daily. Perfect Amino    . Ascorbic Acid (VITAMIN C) 1000 MG tablet Take 1,000 mg by mouth daily.     Marland Kitchen b complex vitamins tablet Take 1 tablet by mouth daily.    . ciprofloxacin (CIPRO) 500 MG tablet Take 500 mg by mouth 2 (two) times daily.     Marland Kitchen doxycycline (VIBRAMYCIN) 100 MG  capsule Take 100 mg by mouth 2 (two) times daily.     . Magnesium 500 MG TABS Take 500 mg by mouth daily.     . Multiple Minerals-Vitamins (CAL-MAG-ZINC-D PO) Take 1 tablet by mouth daily.    . Multiple Vitamin (MULTIVITAMIN WITH MINERALS) TABS tablet Take 1 tablet by mouth daily.    . Nicotinamide Riboside Chloride POWD Take 300 mg by mouth daily.     . Omega-3 Fatty Acids (FISH OIL PO) Take 1 capsule by mouth daily.    Marland Kitchen RESVERATROL PO Take 1,000 mg by mouth daily.     . TURMERIC PO Take 1 capsule by mouth daily.    . vitamin B-12 (CYANOCOBALAMIN) 1000 MCG tablet Take 1,000 mcg by mouth daily.     Marland Kitchen oxyCODONE (OXY IR/ROXICODONE) 5 MG immediate release tablet Take 5 mg by mouth every 4 (four) hours as needed.     Facility-Administered Medications Prior to Visit  Medication Dose Route Frequency Provider Last Rate Last Admin  . 0.9 %  sodium chloride infusion  500 mL Intravenous Once Irene Shipper, MD         Past Medical History:  Diagnosis Date  . Ataxia   . Chronic headaches   . Fatty liver       Past Surgical History:  Procedure  Laterality Date  . TENDON REPAIR Left 11/22/2020   Procedure: Left ring finger open tendon synovectomy with exploration including biopsy and culture;  Surgeon: Roseanne Kaufman, MD;  Location: [facility];  Service: Orthopedics;  Laterality: Left;  60 mins Block versus General anesthesia      Family History  Problem Relation Age of Onset  . Diabetes Mother   . Hypertension Brother   . Obesity Brother   . Alcoholism Brother   . Colon cancer Neg Hx   . Rectal cancer Neg Hx       Social History   Socioeconomic History  . Marital status: Married    Spouse name: Not on file  . Number of children: Not on file  . Years of education: Not on file  . Highest education level: Not on file  Occupational History  . Occupation: Geophysicist/field seismologist  Tobacco Use  . Smoking status: Never Smoker  . Smokeless tobacco: Never Used  Vaping Use  . Vaping Use: Never  assessed  Substance and Sexual Activity  . Alcohol use: Yes    Alcohol/week: 1.0 standard drink    Types: 1 Cans of beer per week  . Drug use: Never  . Sexual activity: Not on file  Other Topics Concern  . Not on file  Social History Narrative  . Not on file   Social Determinants of Health   Financial Resource Strain:   . Difficulty of Paying Living Expenses: Not on file  Food Insecurity:   . Worried About Charity fundraiser in the Last Year: Not on file  . Ran Out of Food in the Last Year: Not on file  Transportation Needs:   . Lack of Transportation (Medical): Not on file  . Lack of Transportation (Non-Medical): Not on file  Physical Activity:   . Days of Exercise per Week: Not on file  . Minutes of Exercise per Session: Not on file  Stress:   . Feeling of Stress : Not on file  Social Connections:   . Frequency of Communication with Friends and Family: Not on file  . Frequency of Social Gatherings with Friends and Family: Not on file  . Attends Religious Services: Not on file  . Active Member of Clubs or Organizations: Not on file  . Attends Archivist Meetings: Not on file  . Marital Status: Not on file  Intimate Partner Violence:   . Fear of Current or Ex-Partner: Not on file  . Emotionally Abused: Not on file  . Physically Abused: Not on file  . Sexually Abused: Not on file      Review of Systems   negative 11 point ros unless mentioned above  Objective:    BP 127/67   Pulse (!) 58   Temp 97.9 F (36.6 C)   Resp 16   Ht $R'5\' 10"'ZD$  (1.778 m)   Wt 178 lb (80.7 kg)   SpO2 98%   BMI 25.54 kg/m  Nursing note and vital signs reviewed.  Physical Exam    well appearing, no distress, good historian Heent: normocephalic; per; conj clear; eomi Neck supple Lymph no cervical lymphadenopathy Lungs clear normal respiratory effort cv rrr no mrg abd s/nt Ext no edema Skin no rash Msk: left hand 4th finger suture intact/incision without dehiscence; no  swelling in fingers or erythema. Left hand/wrist in bulky dressing with splint   Labs: None  Micro: 12/4 bacterial cx negative 12/4 afb cx in progress (stain negative)  Path: 12/4 surgical path chronic inflammation  with reactive changes.  Assessment & Plan:   Patient Active Problem List   Diagnosis Date Noted  . RECTAL BLEEDING 05/08/2008  . CHANGE IN BOWELS 05/08/2008  . 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 However no improvement at all on doxy/cipro suggest against the latter  -given sensitive territory/and good exposure story, will start empiric tx for m marinum -regimen rifampin 600, ethambutol 1200, and azith 250 mg daily -baseline labs today -stop doxy/cipro -f/u 1 week to see if progression off doxy/cipro  -have sent message inbasket to Dr Tresa Moore to review path and add fite/gms stain   I have discontinued Brady Alexander. Brady "Steve"'s oxyCODONE. I am also having him maintain his vitamin C, vitamin B-12, Magnesium, b complex vitamins, acetaminophen, multivitamin with minerals, Omega-3 Fatty Acids (FISH OIL PO), TURMERIC PO, RESVERATROL PO, Nicotinamide Riboside Chloride, Amino Acids (AMINO ACID PO), ciprofloxacin, doxycycline, and Multiple Minerals-Vitamins (CAL-MAG-ZINC-D PO). We will continue to administer sodium chloride.   No orders of the defined types were placed in this encounter.    Follow-up: Return in about 1 week (around 12/03/2020).      Jabier Mutton, [facility] for [facility] [place] -- -- pager   [phone] cell 11/26/2020, 11:41 AM

## 2020-11-26 NOTE — Patient Instructions (Signed)
Thank you for seeing Brady Alexander today  It appears that this is likely an NTM infectious process   The regimen would be: Azithromycin Rifampin Ethambutol  All once a day  Stop ciprofloxacin and doxycycline   Follow up in 1 week with Brady Alexander. Please do a set of blood tests today

## 2020-11-26 NOTE — Addendum Note (Signed)
Addended by: Clayborne Artist A on: 11/26/2020 05:24 PM   Modules accepted: Orders

## 2020-11-27 LAB — CBC
HCT: 48 % (ref 38.5–50.0)
Hemoglobin: 15.9 g/dL (ref 13.2–17.1)
MCH: 28.5 pg (ref 27.0–33.0)
MCHC: 33.1 g/dL (ref 32.0–36.0)
MCV: 86.2 fL (ref 80.0–100.0)
MPV: 9.5 fL (ref 7.5–12.5)
Platelets: 240 10*3/uL (ref 140–400)
RBC: 5.57 10*6/uL (ref 4.20–5.80)
RDW: 13.1 % (ref 11.0–15.0)
WBC: 4.9 10*3/uL (ref 3.8–10.8)

## 2020-11-27 LAB — COMPREHENSIVE METABOLIC PANEL
AG Ratio: 2.1 (calc) (ref 1.0–2.5)
ALT: 20 U/L (ref 9–46)
AST: 28 U/L (ref 10–35)
Albumin: 4.6 g/dL (ref 3.6–5.1)
Alkaline phosphatase (APISO): 40 U/L (ref 35–144)
BUN: 23 mg/dL (ref 7–25)
CO2: 26 mmol/L (ref 20–32)
Calcium: 9.5 mg/dL (ref 8.6–10.3)
Chloride: 101 mmol/L (ref 98–110)
Creat: 1.09 mg/dL (ref 0.70–1.33)
Globulin: 2.2 g/dL (calc) (ref 1.9–3.7)
Glucose, Bld: 82 mg/dL (ref 65–99)
Potassium: 4.2 mmol/L (ref 3.5–5.3)
Sodium: 136 mmol/L (ref 135–146)
Total Bilirubin: 0.6 mg/dL (ref 0.2–1.2)
Total Protein: 6.8 g/dL (ref 6.1–8.1)

## 2020-11-27 LAB — ANAEROBIC CULTURE

## 2020-11-27 LAB — SURGICAL PATHOLOGY

## 2020-11-27 LAB — AEROBIC/ANAEROBIC CULTURE W GRAM STAIN (SURGICAL/DEEP WOUND): Culture: NO GROWTH

## 2020-11-27 LAB — C-REACTIVE PROTEIN: CRP: 0.5 mg/L (ref <8.0)

## 2020-12-03 ENCOUNTER — Ambulatory Visit: Payer: 59 | Admitting: Internal Medicine

## 2020-12-04 ENCOUNTER — Encounter: Payer: Self-pay | Admitting: Internal Medicine

## 2020-12-04 ENCOUNTER — Telehealth: Payer: Self-pay

## 2020-12-04 ENCOUNTER — Telehealth (INDEPENDENT_AMBULATORY_CARE_PROVIDER_SITE_OTHER): Payer: 59 | Admitting: Internal Medicine

## 2020-12-04 ENCOUNTER — Other Ambulatory Visit: Payer: Self-pay

## 2020-12-04 DIAGNOSIS — L0390 Cellulitis, unspecified: Secondary | ICD-10-CM | POA: Diagnosis not present

## 2020-12-04 MED ORDER — AZITHROMYCIN 250 MG PO TABS: ORAL_TABLET | ORAL | 1 refills | Status: DC

## 2020-12-04 NOTE — Patient Instructions (Signed)
This is a televisit  Patient will continue on current ethambutol/rifampin/azith Renew azithromycin  Advise him to get baseline eye exam in setting ethambutol use  Finger less swollen and painful; no wound breakdown. Improving off doxy/cipro   Follow up in 4 weeks with preclinic labs a week prior

## 2020-12-04 NOTE — Progress Notes (Signed)
[facility] for Infectious Disease  Patient Active Problem List   Diagnosis Date Noted  . RECTAL BLEEDING 05/08/2008  . CHANGE IN BOWELS 05/08/2008  . ABDOMINAL PAIN, GENERALIZED 05/08/2008      Subjective:    Patient ID: Brady Alexander, male    DOB: 1966/06/29, 54 y.o.   MRN: [id number]  Chief Complaint  Patient presents with  . Follow-up    Tenosynovitis of hand    HPI:  Brady Alexander is a 54 y.o. male   Late 09/2020 went diving/snorkling in [place]. One day he didn't use glove and touch left index finger on a coral reef. Within a few days noticed a skin colored bump. Gradually had swelling and mild pain and some redness. Denies touching any fish but did a sea cucumber being eating by a conch. He is here for f/u visit  This is a televideo visit I discussed the limitations of evaluation and management by telemedicine and the availability of in person appointments. The patietn expressed understanding and agreed to proceed.   Time spent: 15 minutes   Patient location: home   Provider location: office   12/16 id televisit Swelling/pain improves left 4th finger; surgical incision healed no dehiscence No n/v/diarrhea/rash Culture remains no growth  Background: ------ Initially given doxycycline, then cipro added for 2 more weeks  A week ago given no change but progression, surgery was done by dr Amedeo Plenty. 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 Again no fever/chill. Swelling had much improved He is still on doxy/cipro  The path show chronic inflammation with reactive changes The afb stain is negative and cx is still in progress from 12/4   No Known Allergies    Outpatient Medications Prior to Visit  Medication Sig Dispense Refill  . Amino Acids (AMINO ACID PO) Take 1 tablet by mouth daily. Perfect  Amino    . Ascorbic Acid (VITAMIN C) 1000 MG tablet Take 1,000 mg by mouth daily.     Marland Kitchen azithromycin (ZITHROMAX) 250 MG tablet Take 1 tablet (250 mg total) by mouth daily. 30 each 1  . b complex vitamins tablet Take 1 tablet by mouth daily.    Marland Kitchen ethambutol (MYAMBUTOL) 400 MG tablet Take 3 tablets (1,200 mg total) by mouth daily. 90 tablet 1  . Magnesium 500 MG TABS Take 500 mg by mouth daily.     . Multiple Minerals-Vitamins (CAL-MAG-ZINC-D PO) Take 1 tablet by mouth daily.    . Multiple Vitamin (MULTIVITAMIN WITH MINERALS) TABS tablet Take 1 tablet by mouth daily.    . Nicotinamide Riboside Chloride POWD Take 300 mg by mouth daily.     . Omega-3 Fatty Acids (FISH OIL PO) Take 1 capsule by mouth daily.    Marland Kitchen RESVERATROL PO Take 1,000 mg by mouth daily.     . rifampin (RIFADIN) 300 MG capsule Take 2 capsules (600 mg total) by mouth daily. 60 capsule 1  . TURMERIC PO Take 1 capsule by mouth daily.    . vitamin B-12 (CYANOCOBALAMIN) 1000 MCG tablet Take 1,000 mcg by mouth daily.     Marland Kitchen acetaminophen (TYLENOL) 325 MG tablet Take 325-650 mg by mouth every 6 (six) hours as needed for headache (pain).    . ciprofloxacin (CIPRO) 500 MG tablet Take 500 mg by mouth 2 (two) times daily.     Marland Kitchen  doxycycline (VIBRAMYCIN) 100 MG capsule Take 100 mg by mouth 2 (two) times daily.      Facility-Administered Medications Prior to Visit  Medication Dose Route Frequency Provider Last Rate Last Admin  . 0.9 %  sodium chloride infusion  500 mL Intravenous Once Irene Shipper, MD         Past Medical History:  Diagnosis Date  . Ataxia   . Chronic headaches   . Fatty liver       Past Surgical History:  Procedure Laterality Date  . TENDON REPAIR Left 11/22/2020   Procedure: Left ring finger open tendon synovectomy with exploration including biopsy and culture;  Surgeon: Roseanne Kaufman, MD;  Location: [facility];  Service: Orthopedics;  Laterality: Left;  60 mins Block versus General anesthesia      Family  History  Problem Relation Age of Onset  . Diabetes Mother   . Hypertension Brother   . Obesity Brother   . Alcoholism Brother   . Colon cancer Neg Hx   . Rectal cancer Neg Hx       Social History   Socioeconomic History  . Marital status: Married    Spouse name: Not on file  . Number of children: Not on file  . Years of education: Not on file  . Highest education level: Not on file  Occupational History  . Occupation: Geophysicist/field seismologist  Tobacco Use  . Smoking status: Never Smoker  . Smokeless tobacco: Never Used  Vaping Use  . Vaping Use: Not on file  Substance and Sexual Activity  . Alcohol use: Yes    Alcohol/week: 1.0 standard drink    Types: 1 Cans of beer per week  . Drug use: Never  . Sexual activity: Not on file  Other Topics Concern  . Not on file  Social History Narrative  . Not on file   Social Determinants of Health   Financial Resource Strain: Not on file  Food Insecurity: Not on file  Transportation Needs: Not on file  Physical Activity: Not on file  Stress: Not on file  Social Connections: Not on file  Intimate Partner Violence: Not on file      Review of Systems   negative 11 point ros unless mentioned above  Objective:    There were no vitals taken for this visit. Nursing note and vital signs reviewed.  Physical Exam    well appearing, no distress, good historian Heent: normocephalic; per; conj clear; eomi msk/Skin no rash; left 4th finger slightly decreased ip flexion; slight swelling noted; palmar incision healed; no erythema   Labs: None  Micro: 12/4 bacterial cx negative 12/4 afb cx in progress (stain negative)  Path: 12/4 surgical path chronic inflammation with reactive changes.  Assessment & Plan:   Patient Active Problem List   Diagnosis Date Noted  . RECTAL BLEEDING 05/08/2008  . CHANGE IN BOWELS 05/08/2008  . 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  However no improvement at all on doxy/cipro suggest against the latter.  12/16 id f/u assessment cx ngtd. m marinum is a slow grower so will see if anything grows in the next 2-4 weeks. Sx not worse off doxy/cipro reassuring we are not missing typical bacteria infection that could have gotten deep and just needed debridement besides abx.   Path on 12/4 without granuloma; gms/afb stain negative. So question of potentially typical organisms infection that would require I&D remains. Will see within the next few weeks   -continue rifampin 600, ethambutol 1200, and azith 250 mg daily -duration anticipate 1-2 months at least -will f/u patient in 3-4 weeks with preclinic labs  -advise ptaient to get baseline optometric exam in setting of chronic use ethambutol   I am having Brady Alexander. Brady "Richardson Landry" maintain his vitamin C, vitamin B-12, Magnesium, b complex vitamins, acetaminophen, multivitamin with minerals, Omega-3 Fatty Acids (FISH OIL PO), TURMERIC PO, RESVERATROL PO, Nicotinamide Riboside Chloride, Amino Acids (AMINO ACID PO), ciprofloxacin, doxycycline, Multiple Minerals-Vitamins (CAL-MAG-ZINC-D PO), azithromycin, rifampin, and ethambutol. We will continue to administer sodium chloride.   No orders of the defined types were placed in this encounter.    Follow-up: No follow-ups on file.      Jabier Mutton, [facility] [place] -- -- pager   [phone] cell 12/04/2020, 11:15 AM

## 2020-12-04 NOTE — Telephone Encounter (Addendum)
ERROR

## 2020-12-23 LAB — FUNGUS CULTURE WITH STAIN

## 2020-12-23 LAB — FUNGAL ORGANISM REFLEX

## 2020-12-23 LAB — FUNGUS CULTURE RESULT

## 2020-12-29 ENCOUNTER — Telehealth: Payer: Self-pay

## 2020-12-29 NOTE — Telephone Encounter (Signed)
Followed up with patient about making appointment virtual, left a voicemail to call us back because he needs labs before if possible

## 2020-12-30 ENCOUNTER — Ambulatory Visit: Payer: 59 | Admitting: Internal Medicine

## 2020-12-31 ENCOUNTER — Other Ambulatory Visit: Payer: Self-pay

## 2020-12-31 ENCOUNTER — Other Ambulatory Visit: Payer: BC Managed Care – PPO

## 2020-12-31 DIAGNOSIS — L0390 Cellulitis, unspecified: Secondary | ICD-10-CM

## 2021-01-01 LAB — CBC WITH DIFFERENTIAL/PLATELET
Absolute Monocytes: 536 cells/uL (ref 200–950)
Basophils Absolute: 32 cells/uL (ref 0–200)
Basophils Relative: 0.7 %
Eosinophils Absolute: 99 cells/uL (ref 15–500)
Eosinophils Relative: 2.2 %
HCT: 43.6 % (ref 38.5–50.0)
Hemoglobin: 14.7 g/dL (ref 13.2–17.1)
Lymphs Abs: 1323 cells/uL (ref 850–3900)
MCH: 29 pg (ref 27.0–33.0)
MCHC: 33.7 g/dL (ref 32.0–36.0)
MCV: 86 fL (ref 80.0–100.0)
MPV: 9.4 fL (ref 7.5–12.5)
Monocytes Relative: 11.9 %
Neutro Abs: 2511 cells/uL (ref 1500–7800)
Neutrophils Relative %: 55.8 %
Platelets: 191 10*3/uL (ref 140–400)
RBC: 5.07 10*6/uL (ref 4.20–5.80)
RDW: 13.1 % (ref 11.0–15.0)
Total Lymphocyte: 29.4 %
WBC: 4.5 10*3/uL (ref 3.8–10.8)

## 2021-01-01 LAB — COMPREHENSIVE METABOLIC PANEL
AG Ratio: 2.2 (calc) (ref 1.0–2.5)
ALT: 50 U/L — ABNORMAL HIGH (ref 9–46)
AST: 39 U/L — ABNORMAL HIGH (ref 10–35)
Albumin: 4.4 g/dL (ref 3.6–5.1)
Alkaline phosphatase (APISO): 50 U/L (ref 35–144)
BUN: 17 mg/dL (ref 7–25)
CO2: 29 mmol/L (ref 20–32)
Calcium: 9.1 mg/dL (ref 8.6–10.3)
Chloride: 105 mmol/L (ref 98–110)
Creat: 1.19 mg/dL (ref 0.70–1.33)
Globulin: 2 g/dL (calc) (ref 1.9–3.7)
Glucose, Bld: 125 mg/dL — ABNORMAL HIGH (ref 65–99)
Potassium: 4.1 mmol/L (ref 3.5–5.3)
Sodium: 140 mmol/L (ref 135–146)
Total Bilirubin: 0.6 mg/dL (ref 0.2–1.2)
Total Protein: 6.4 g/dL (ref 6.1–8.1)

## 2021-01-01 LAB — C-REACTIVE PROTEIN: CRP: 0.7 mg/L (ref <8.0)

## 2021-01-02 ENCOUNTER — Other Ambulatory Visit: Payer: Self-pay

## 2021-01-02 ENCOUNTER — Telehealth (INDEPENDENT_AMBULATORY_CARE_PROVIDER_SITE_OTHER): Payer: BC Managed Care – PPO | Admitting: Internal Medicine

## 2021-01-02 DIAGNOSIS — M659 Synovitis and tenosynovitis, unspecified: Secondary | ICD-10-CM | POA: Diagnosis not present

## 2021-01-02 NOTE — Patient Instructions (Signed)
Finish 3 weeks abx F/u prn

## 2021-01-02 NOTE — Progress Notes (Signed)
[facility]  Patient Active Problem List   Diagnosis Date Noted  . RECTAL BLEEDING 05/08/2008  . CHANGE IN BOWELS 05/08/2008  . ABDOMINAL PAIN, GENERALIZED 05/08/2008      Subjective:    Patient ID: Brady Alexander, male    DOB: 1966/10/13, 55 y.o.   MRN: [id number]  Chief Complaint  Patient presents with  . Follow-up    No complaints; reports improvement in finger; decreased swelling, no drainage reported after surgery; taking oral abx with no difficulty. Has had eye exam with no reported issues.    HPI:  Brady Alexander is a 55 y.o. male   Late 09/2020 went diving/snorkling in [place]. One day he didn't use glove and touch left index finger on a coral reef. Within a few days noticed a skin colored bump. Gradually had swelling and mild pain and some redness. Denies touching any fish but did a sea cucumber being eating by a conch. He is here for f/u visit  This is a televideo visit I discussed the limitations of evaluation and management by telemedicine and the availability of in person appointments. The patietn expressed understanding and agreed to proceed.   Time spent: 15 minutes   Patient location: home   Provider location: office  01/02/2021 televideo visit Doing very well, swelling continues to decrease. No pain. Saw eye doctor yesterday and normal vision test Is in his first rifampin/ethambutol refill, and will have 3 more weeks lft a little up but doing well no n/v Eating well No f/c No diarrhea    12/16 id televisit Swelling/pain improves left 4th finger; surgical incision healed no dehiscence No n/v/diarrhea/rash Culture remains no growth  Background: ------ Initially given doxycycline, then cipro added for 2 more weeks  A week ago given no change but progression, surgery was done by dr Amanda Pea. He mentioned it looks like NTM (tenosynovitis with granuloma  No fever chill Not on prednisone or other  immunosuppressant No chronic medical problems  He has mild pain after surgery. Again no fever/chill. Swelling had much improved He is still on doxy/cipro  The path show chronic inflammation with reactive changes The afb stain is negative and cx is still in progress from 12/4   No Known Allergies    Outpatient Medications Prior to Visit  Medication Sig Dispense Refill  . acetaminophen (TYLENOL) 325 MG tablet Take 325-650 mg by mouth every 6 (six) hours as needed for headache (pain).    . Amino Acids (AMINO ACID PO) Take 1 tablet by mouth daily. Perfect Amino    . Ascorbic Acid (VITAMIN C) 1000 MG tablet Take 1,000 mg by mouth daily.     Marland Kitchen azithromycin (ZITHROMAX) 250 MG tablet Take 1 tablet (250 mg total) by mouth daily. 30 each 1  . azithromycin (ZITHROMAX) 250 MG tablet Take 1 tablet daily along with ethambutol and rifampin for soft tissue infection 30 each 1  . b complex vitamins tablet Take 1 tablet by mouth daily.    . ciprofloxacin (CIPRO) 500 MG tablet Take 500 mg by mouth 2 (two) times daily.     Marland Kitchen doxycycline (VIBRAMYCIN) 100 MG capsule Take 100 mg by mouth 2 (two) times daily.     . Magnesium 500 MG TABS Take 500 mg by mouth daily.     . Multiple Minerals-Vitamins (CAL-MAG-ZINC-D PO) Take 1 tablet by mouth daily.    . Multiple Vitamin (MULTIVITAMIN WITH MINERALS) TABS tablet Take 1 tablet by  mouth daily.    . Nicotinamide Riboside Chloride POWD Take 300 mg by mouth daily.     . Omega-3 Fatty Acids (FISH OIL PO) Take 1 capsule by mouth daily.    Marland Kitchen RESVERATROL PO Take 1,000 mg by mouth daily.     . TURMERIC PO Take 1 capsule by mouth daily.    . vitamin B-12 (CYANOCOBALAMIN) 1000 MCG tablet Take 1,000 mcg by mouth daily.      Facility-Administered Medications Prior to Visit  Medication Dose Route Frequency Provider Last Rate Last Admin  . 0.9 %  sodium chloride infusion  500 mL Intravenous Once Hilarie Fredrickson, MD         Past Medical History:  Diagnosis Date  . Ataxia    . Chronic headaches   . Fatty liver       Past Surgical History:  Procedure Laterality Date  . TENDON REPAIR Left 11/22/2020   Procedure: Left ring finger open tendon synovectomy with exploration including biopsy and culture;  Surgeon: Dominica Severin, MD;  Location: [facility] OR;  Service: Orthopedics;  Laterality: Left;  60 mins Block versus General anesthesia      Family History  Problem Relation Age of Onset  . Diabetes Mother   . Hypertension Brother   . Obesity Brother   . Alcoholism Brother   . Colon cancer Neg Hx   . Rectal cancer Neg Hx       Social History   Socioeconomic History  . Marital status: Married    Spouse name: Not on file  . Number of children: Not on file  . Years of education: Not on file  . Highest education level: Not on file  Occupational History  . Occupation: Environmental manager  Tobacco Use  . Smoking status: Never Smoker  . Smokeless tobacco: Never Used  Vaping Use  . Vaping Use: Not on file  Substance and Sexual Activity  . Alcohol use: Yes    Alcohol/week: 1.0 standard drink    Types: 1 Cans of beer per week  . Drug use: Never  . Sexual activity: Not on file  Other Topics Concern  . Not on file  Social History Narrative  . Not on file   Social Determinants of Health   Financial Resource Strain: Not on file  Food Insecurity: Not on file  Transportation Needs: Not on file  Physical Activity: Not on file  Stress: Not on file  Social Connections: Not on file  Intimate Partner Violence: Not on file      Review of Systems Other ros negative  Objective:    There were no vitals taken for this visit. Nursing note and vital signs reviewed.  Physical Exam    well appearing, no distress Heent: normocephalic; per; conj clear; eomi msk/Skin no rash; left 4th finger no erythema; no gross swelling noted; full rom   Labs: 1/12 crp 0.7; lft 39/50/50/0.6; cr 1.2; cbc 4.5/15/191  Micro: 12/4 bacterial cx negative 12/4 afb cx in  progress (stain negative)  Path: 12/4 surgical path chronic inflammation with reactive changes.  Assessment & Plan:   Patient Active Problem List   Diagnosis Date Noted  . RECTAL BLEEDING 05/08/2008  . CHANGE IN BOWELS 05/08/2008  . ABDOMINAL PAIN, GENERALIZED 05/08/2008     Problem List Items Addressed This Visit   None   Visit Diagnoses    Tenosynovitis    -  Primary     55 yo male with left hand 4th finger cellulitis/tenosynovitis after a sea  water reef contact, s/p I&d, here for f/u   Chronic deep tissue infection that appears as a nodule with swelling that is progressive with epideimiology exposure for m marinum along with marine water typical bacteria. However no improvement at all on doxy/cipro suggest against the latter.  12/16 id f/u assessment cx ngtd. m marinum is a slow grower so will see if anything grows in the next 2-4 weeks. Sx not worse off doxy/cipro reassuring we are not missing typical bacteria infection that could have gotten deep and just needed debridement besides abx.   Path on 12/4 without granuloma; gms/afb stain negative. So question of potentially typical organisms infection that would require I&D remains. Will see within the next few weeks  01/02/21 assessment  cx continues to be negative for ntm. He is into his week 5 of rif/etham/azith I think we can stop this empiric regimen today, but patient prefer to keep finish another 3 weeks. There is no exact science. I think it is ok. His lft is a little up but no sx  -continue rifampin 600, ethambutol 1200, and azith 250 mg daily for another 3weeks -f/u as needed if worsening swelling/pain/ulcer/redness. Call us if nausea/vomiting, rash/jaundice so we can retest lft  I am having Brady Alexander. Everage "Brady Alexander" maintain his vitamin C, vitamin B-12, Magnesium, b complex vitamins, acetaminophen, multivitamin with minerals, Omega-3 Fatty Acids (FISH OIL PO), TURMERIC PO, RESVERATROL PO, Nicotinamide Riboside  Chloride, Amino Acids (AMINO ACID PO), ciprofloxacin, doxycycline, Multiple Minerals-Vitamins (CAL-MAG-ZINC-D PO), azithromycin, and azithromycin. We will continue to administer sodium chloride.   No orders of the defined types were placed in this encounter.    Follow-up: No follow-ups on file.      Brady Band, MD [facility] -- -- pager   [phone] cell 01/02/2021, 11:38 AM

## 2021-01-06 LAB — ACID FAST CULTURE WITH REFLEXED SENSITIVITIES (MYCOBACTERIA)
Acid Fast Culture: NEGATIVE
Acid Fast Culture: NEGATIVE

## 2022-05-02 ENCOUNTER — Emergency Department: Payer: BC Managed Care – PPO

## 2022-05-02 ENCOUNTER — Emergency Department
Admission: EM | Admit: 2022-05-02 | Discharge: 2022-05-03 | Disposition: A | Discharge status: Home / Self Care | Payer: BC Managed Care – PPO | Attending: Emergency Medicine | Admitting: Emergency Medicine

## 2022-05-02 ENCOUNTER — Other Ambulatory Visit: Payer: Self-pay

## 2022-05-02 DIAGNOSIS — T40711A Poisoning by cannabis, accidental (unintentional), initial encounter: Secondary | ICD-10-CM | POA: Insufficient documentation

## 2022-05-02 DIAGNOSIS — F419 Anxiety disorder, unspecified: Secondary | ICD-10-CM | POA: Insufficient documentation

## 2022-05-02 DIAGNOSIS — F439 Reaction to severe stress, unspecified: Secondary | ICD-10-CM | POA: Diagnosis not present

## 2022-05-02 DIAGNOSIS — T50901A Poisoning by unspecified drugs, medicaments and biological substances, accidental (unintentional), initial encounter: Secondary | ICD-10-CM

## 2022-05-02 DIAGNOSIS — R1030 Lower abdominal pain, unspecified: Secondary | ICD-10-CM | POA: Diagnosis present

## 2022-05-02 DIAGNOSIS — F12929 Cannabis use, unspecified with intoxication, unspecified: Secondary | ICD-10-CM | POA: Insufficient documentation

## 2022-05-02 DIAGNOSIS — R944 Abnormal results of kidney function studies: Secondary | ICD-10-CM | POA: Insufficient documentation

## 2022-05-02 DIAGNOSIS — F12920 Cannabis use, unspecified with intoxication, uncomplicated: Secondary | ICD-10-CM

## 2022-05-02 LAB — CBC WITH DIFFERENTIAL/PLATELET
Abs Immature Granulocytes: 0.03 10*3/uL (ref 0.00–0.07)
Basophils Absolute: 0.1 10*3/uL (ref 0.0–0.1)
Basophils Relative: 1 %
Eosinophils Absolute: 0.2 10*3/uL (ref 0.0–0.5)
Eosinophils Relative: 2 %
HCT: 43.3 % (ref 39.0–52.0)
Hemoglobin: 14.4 g/dL (ref 13.0–17.0)
Immature Granulocytes: 0 %
Lymphocytes Relative: 42 %
Lymphs Abs: 3.5 10*3/uL (ref 0.7–4.0)
MCH: 28.6 pg (ref 26.0–34.0)
MCHC: 33.3 g/dL (ref 30.0–36.0)
MCV: 86.1 fL (ref 80.0–100.0)
Monocytes Absolute: 0.9 10*3/uL (ref 0.1–1.0)
Monocytes Relative: 11 %
Neutro Abs: 3.7 10*3/uL (ref 1.7–7.7)
Neutrophils Relative %: 44 %
Platelets: 253 10*3/uL (ref 150–400)
RBC: 5.03 MIL/uL (ref 4.22–5.81)
RDW: 13.4 % (ref 11.5–15.5)
WBC: 8.3 10*3/uL (ref 4.0–10.5)
nRBC: 0 % (ref 0.0–0.2)

## 2022-05-02 LAB — URINE DRUG SCREEN, QUALITATIVE (ARMC ONLY)
Amphetamines, Ur Screen: NOT DETECTED
Barbiturates, Ur Screen: NOT DETECTED
Benzodiazepine, Ur Scrn: NOT DETECTED
Cannabinoid 50 Ng, Ur ~~LOC~~: POSITIVE — AB
Cocaine Metabolite,Ur ~~LOC~~: NOT DETECTED
MDMA (Ecstasy)Ur Screen: NOT DETECTED
Methadone Scn, Ur: NOT DETECTED
Opiate, Ur Screen: NOT DETECTED
Phencyclidine (PCP) Ur S: NOT DETECTED
Tricyclic, Ur Screen: NOT DETECTED

## 2022-05-02 LAB — COMPREHENSIVE METABOLIC PANEL
ALT: 26 U/L (ref 0–44)
AST: 41 U/L (ref 15–41)
Albumin: 4.3 g/dL (ref 3.5–5.0)
Alkaline Phosphatase: 48 U/L (ref 38–126)
Anion gap: 8 (ref 5–15)
BUN: 24 mg/dL — ABNORMAL HIGH (ref 6–20)
CO2: 25 mmol/L (ref 22–32)
Calcium: 9.5 mg/dL (ref 8.9–10.3)
Chloride: 106 mmol/L (ref 98–111)
Creatinine, Ser: 1.31 mg/dL — ABNORMAL HIGH (ref 0.61–1.24)
GFR, Estimated: >60 mL/min (ref >60)
Glucose, Bld: 131 mg/dL — ABNORMAL HIGH (ref 70–99)
Potassium: 3.3 mmol/L — ABNORMAL LOW (ref 3.5–5.1)
Sodium: 139 mmol/L (ref 135–145)
Total Bilirubin: 0.6 mg/dL (ref 0.3–1.2)
Total Protein: 6.8 g/dL (ref 6.5–8.1)

## 2022-05-02 LAB — BLOOD GAS, VENOUS
Acid-Base Excess: 1.7 mmol/L (ref 0.0–2.0)
Bicarbonate: 26.6 mmol/L (ref 20.0–28.0)
O2 Saturation: 95.3 %
Patient temperature: 37
pCO2, Ven: 42 mmHg — ABNORMAL LOW (ref 44–60)
pH, Ven: 7.41 (ref 7.25–7.43)
pO2, Ven: 66 mmHg — ABNORMAL HIGH (ref 32–45)

## 2022-05-02 LAB — URINALYSIS, ROUTINE W REFLEX MICROSCOPIC
Bilirubin Urine: NEGATIVE
Glucose, UA: NEGATIVE mg/dL
Hgb urine dipstick: NEGATIVE
Ketones, ur: NEGATIVE mg/dL
Leukocytes,Ua: NEGATIVE
Nitrite: NEGATIVE
Protein, ur: NEGATIVE mg/dL
Specific Gravity, Urine: 1.027 (ref 1.005–1.030)
pH: 5 (ref 5.0–8.0)

## 2022-05-02 LAB — ETHANOL: Alcohol, Ethyl (B): <10 mg/dL (ref <10)

## 2022-05-02 LAB — TSH: TSH: 2.879 u[IU]/mL (ref 0.350–4.500)

## 2022-05-02 MED ORDER — SODIUM CHLORIDE 0.9 % IV BOLUS
1000.0000 mL | Freq: Once | INTRAVENOUS | Status: AC
  Administered 2022-05-02: 1000 mL via INTRAVENOUS

## 2022-05-02 MED ORDER — LORAZEPAM 2 MG/ML IJ SOLN
1.0000 mg | Freq: Once | INTRAMUSCULAR | Status: AC
  Administered 2022-05-02: 1 mg via INTRAVENOUS
  Filled 2022-05-02: qty 1

## 2022-05-02 NOTE — ED Notes (Signed)
Pt c/o of strange feeling of panic. Pt states he has had a panic attack in the past. Pt's speech is pressured at this time.  ?

## 2022-05-02 NOTE — ED Provider Notes (Signed)
? ?[facility] ?Provider Note ? ? ? Event Date/Time  ? First MD Initiated Contact with Patient 05/02/22 2206   ?  (approximate) ? ? ?History  ? ?Abdominal Pain ? ? ?HPI ? ?Brady Alexander is a 56 y.o. male  here with AMS. Pt reports that earlier today, he began to experience a mild lower abdominal cramping pain, followed by a sensation of extreme anxiety and "doom." He has since felt anxious, ilke something is "seriously wrong," with a dry mouth.The pain has been intermittent but not constant. He does not recall any specific new medication, drug use. He does take supplements but no new ones. Reports he feels like something is "very wrong," but unable to further elaborate. No CP.  ? ?  ? ? ?Physical Exam  ? ?Triage Vital Signs: ?ED Triage Vitals  ?Enc Vitals Group  ?   BP 05/02/22 2143 114/64  ?   Pulse Rate 05/02/22 2143 72  ?   Resp 05/02/22 2143 17  ?   Temp 05/02/22 2143 98.4 ?F (36.9 ?C)  ?   Temp Source 05/02/22 2143 Oral  ?   SpO2 05/02/22 2143 100 %  ?   Weight 05/02/22 2145 170 lb (77.1 kg)  ?   Height 05/02/22 2145 5\' 10"  (1.778 m)  ?   Head Circumference --   ?   Peak Flow --   ?   Pain Score 05/02/22 2145 0  ?   Pain Loc --   ?   Pain Edu? --   ?   Excl. in GC? --   ? ? ?Most recent vital signs: ?Vitals:  ? 05/02/22 2143 05/03/22 0014  ?BP: 114/64 120/65  ?Pulse: 72 77  ?Resp: 17 18  ?Temp: 98.4 ?F (36.9 ?C) 98.4 ?F (36.9 ?C)  ?SpO2: 100% 95%  ? ? ? ?General: Awake, no distress.  ?CV:  Good peripheral perfusion. No murmurs, rubs, or gallops.  ?Resp:  Normal effort. Lungs CTAB. ?Abd:  No distention. No tenderness. Specifically, no LLQ tenderness. No rebound or guarding.  ?Other:  Markedly anxious. CN intact. MAE with 5/5 strength. No focal deficits. Dry MM. ? ? ?ED Results / Procedures / Treatments  ? ?Labs ?(all labs ordered are listed, but only abnormal results are displayed) ?Labs Reviewed  ?COMPREHENSIVE METABOLIC PANEL - Abnormal; Notable for the following components:  ?    Result  Value  ? Potassium 3.3 (*)   ? Glucose, Bld 131 (*)   ? BUN 24 (*)   ? Creatinine, Ser 1.31 (*)   ? All other components within normal limits  ?URINALYSIS, ROUTINE W REFLEX MICROSCOPIC - Abnormal; Notable for the following components:  ? Color, Urine YELLOW (*)   ? APPearance CLEAR (*)   ? All other components within normal limits  ?BLOOD GAS, VENOUS - Abnormal; Notable for the following components:  ? pCO2, Ven 42 (*)   ? pO2, Ven 66 (*)   ? All other components within normal limits  ?URINE DRUG SCREEN, QUALITATIVE ([facility] ONLY) - Abnormal; Notable for the following components:  ? Cannabinoid 50 Ng, Ur [place] POSITIVE (*)   ? All other components within normal limits  ?CBC WITH DIFFERENTIAL/PLATELET  ?TSH  ?ETHANOL  ? ? ? ?EKG ?Nromal sinus rhythm, VR 74. PR 190, QRS 96, QTc 446. No acute ST elevations or depressions. No ischemia or infarct. ? ? ?RADIOLOGY ?CT head: NAICA ? ? ?I also independently reviewed and agree with radiologist interpretations. ? ? ?PROCEDURES: ? ?  Critical Care performed: No ? ? ?MEDICATIONS ORDERED IN ED: ?Medications  ?LORazepam (ATIVAN) injection 1 mg (1 mg Intravenous Given 05/02/22 2257)  ?sodium chloride 0.9 % bolus 1,000 mL (0 mLs Intravenous Stopped 05/03/22 0017)  ? ? ? ?IMPRESSION / MDM / ASSESSMENT AND PLAN / ED COURSE  ?I reviewed the triage vital signs and the nursing notes. ?             ?               ? ? ?The patient is on the cardiac monitor to evaluate for evidence of arrhythmia and/or significant heart rate changes. ? ? ?Ddx:  ?Differential includes the following, with pertinent life- or limb-threatening emergencies considered: ? ?Panic attack/panic disorder, intoxication, less likely CVA, partial seizure, metabolic encephalopathy. ? ? ?MDM:  ?Pleasant 56 yo M here with acute onset anxiety, sensation of "doom," and transient abd cramping. Initial DDx broad as above. No focal neurological deficits noted. Pt has h/o anxiety and has had panic attacks before, which is likely based on  sx, though given his o/w lack of history of similar issues, will check CT head, labs, monitor. ? ?CT head shows NAICA. CBC shows no significant leukocytosis or anemia. CMP with possible mild dehydration with elevated BUN/Cr - pt did run/exercise today, IVF given. VBG unremarkable.  TSH wnl.  ? ?Interestingly, UDS + for THC. When further questioned, pt may have accidentally ingested THC-laden brownies or coca cola at home. Son, who is in college, reportedly is at home with possible THC use.  ? ?Suspect acute THC intoxication, accidental. No signs of significant toxidrome. VSS. He is ambulatory and calm after ativan ,fluids. Will d/c with wife for supportive care, return precautions given.  ? ? ?MEDICATIONS GIVEN IN ED: ?Medications  ?LORazepam (ATIVAN) injection 1 mg (1 mg Intravenous Given 05/02/22 2257)  ?sodium chloride 0.9 % bolus 1,000 mL (0 mLs Intravenous Stopped 05/03/22 0017)  ? ? ? ?Consults:  ?None ? ? ?EMR reviewed  ? ? ? ? ? ?FINAL CLINICAL IMPRESSION(S) / ED DIAGNOSES  ? ?Final diagnoses:  ?Cannabis intoxication without complication (HCC)  ?Accidental drug ingestion, initial encounter  ? ? ? ?Rx / DC Orders  ? ?ED Discharge Orders   ? ? None  ? ?  ? ? ? ?Note:  This document was prepared using Dragon voice recognition software and may include unintentional dictation errors. ?  ?Shaune Pollack, MD ?05/03/22 [phone] ? ?

## 2022-05-02 NOTE — ED Triage Notes (Signed)
Pt has a strange feeling called wife in a panic. Pt is calling family telling them goodbye and talking with pressured speech in triage. This RN redirecting pt constantly in order to get triage done. Pt told wife before he came here he was dizzy and his heart was racing.  ?

## 2022-05-03 NOTE — Discharge Instructions (Signed)
Short-term effects of marijuana use include: ?Changes in mood and perception, such as an altered sense of time. ?Increased heart rate. ?Increased appetite. ?Slowed movement, coordination, and reaction time. ?Poor memory, judgment, and problem-solving ability. ?Drowsiness. ?Bloodshot eyes and changes in vision. ?Coughing. ?High doses of marijuana can cause: ?Panic. ?Anxiety. ?Mental confusion. ?Severe dizziness. ?Vomiting. ?Hallucinations. This means you see, hear, taste, smell, or feel things that are not real. ?Coma. ?

## 2022-05-03 NOTE — ED Notes (Signed)
Pt verbalized understanding of discharge instructions and follow-up care instructions. Pt advised if symptoms worsen to return to ED.  

## 2022-05-03 NOTE — ED Notes (Signed)
Pt ambulated down the hallway. Pt ambulated without assistance and with steady gait.  ?

## 2023-10-19 IMAGING — CT CT HEAD W/O CM
4 series · 17 of 47 positions shown, 19 images · non-contrast
Comparison: None Available.

CLINICAL DATA: Altered mental status



[Series 2: head wo · axial · 0.47mm/px · z∈[-160,-40]mm · 7 of 33 slices shown, 9 images]
[im 5/33  brain]
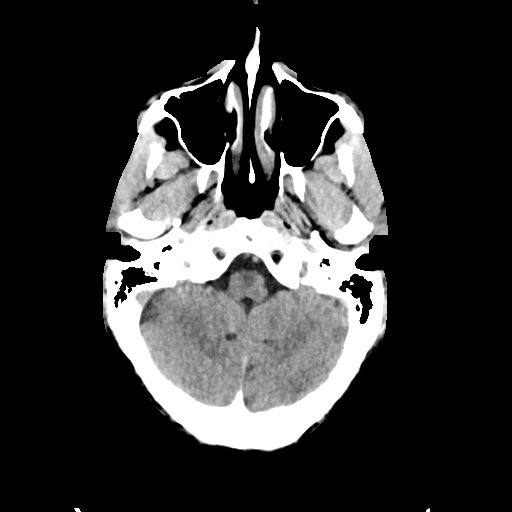
[im 5/33  bone]
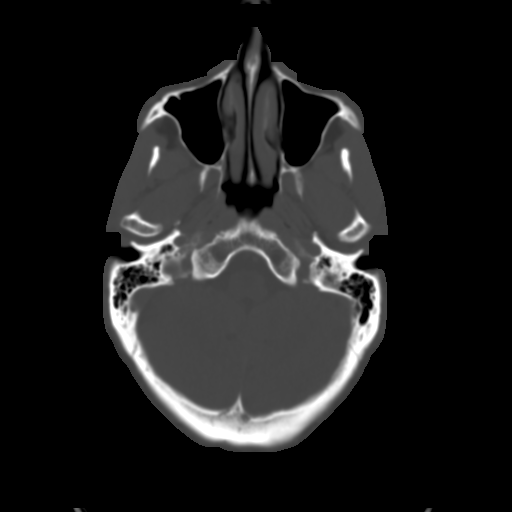
[im 9/33  brain]
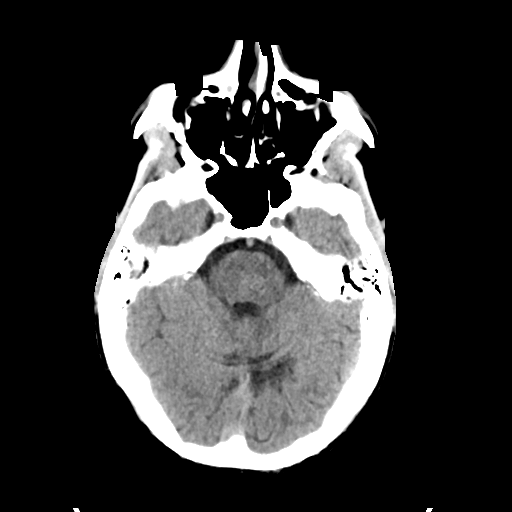
[im 13/33  brain]
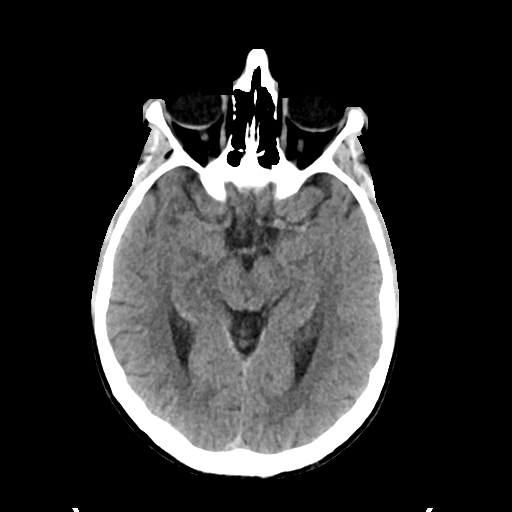
[im 17/33  brain]
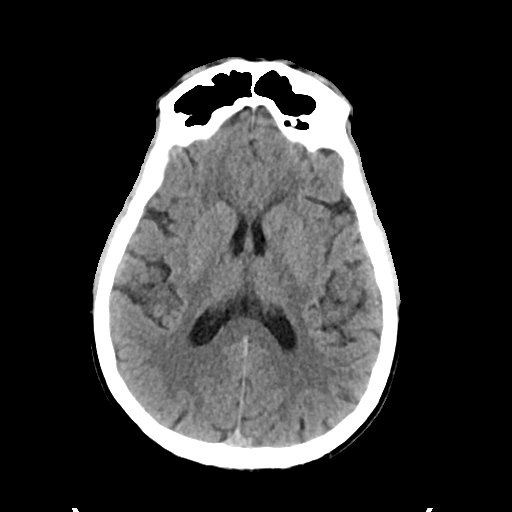
[im 21/33  brain]
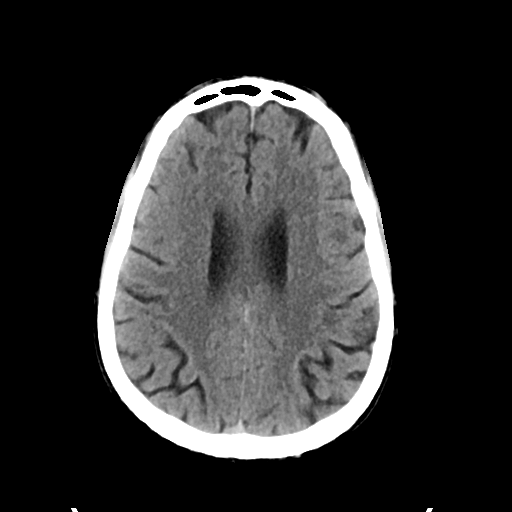
[im 21/33  bone]
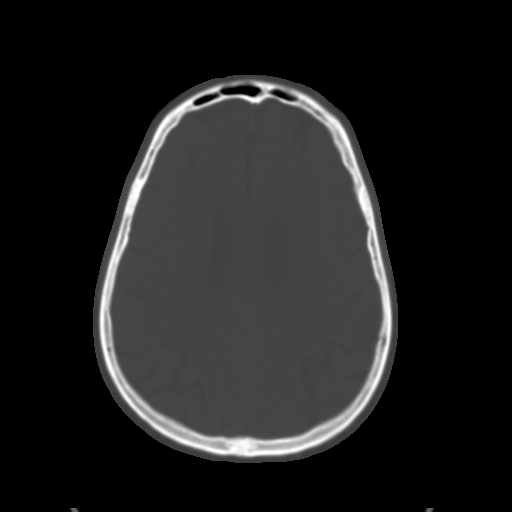
[im 25/33  brain]
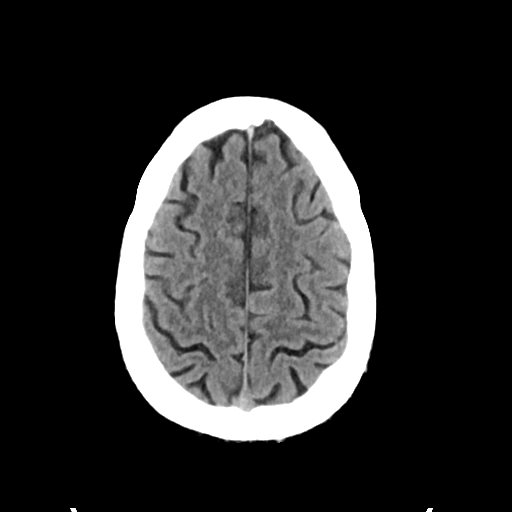
[im 29/33  brain]
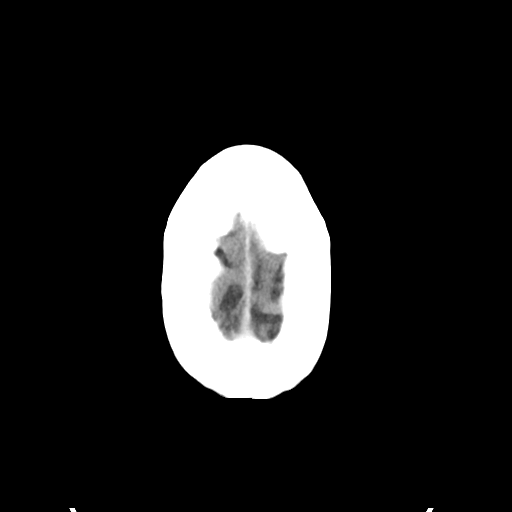

[Series 3: head bone · axial · 0.47mm/px · z∈[-164,-108]mm · 4 of 81 slices shown]
[im 9/81  bone]
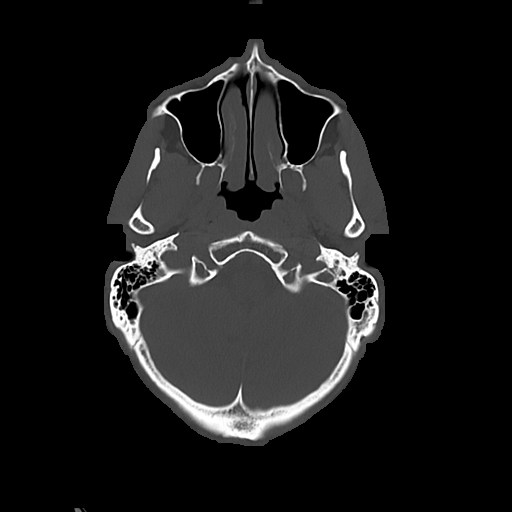
[im 17/81  bone]
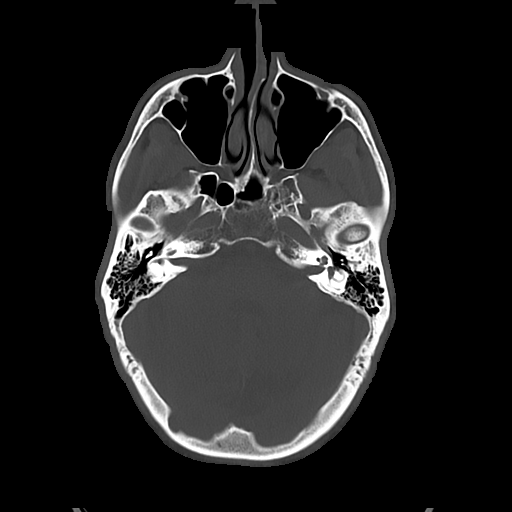
[im 25/81  bone]
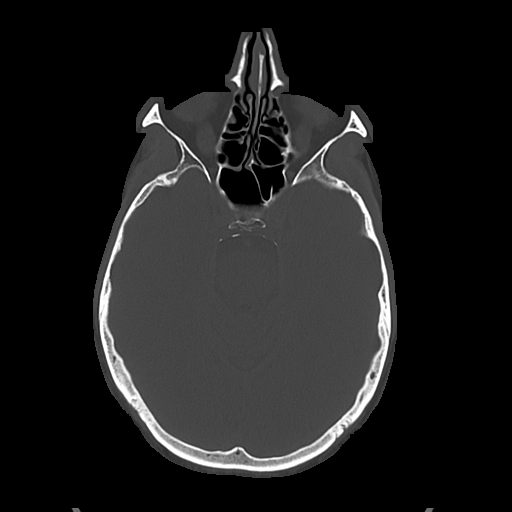
[im 37/81  bone]
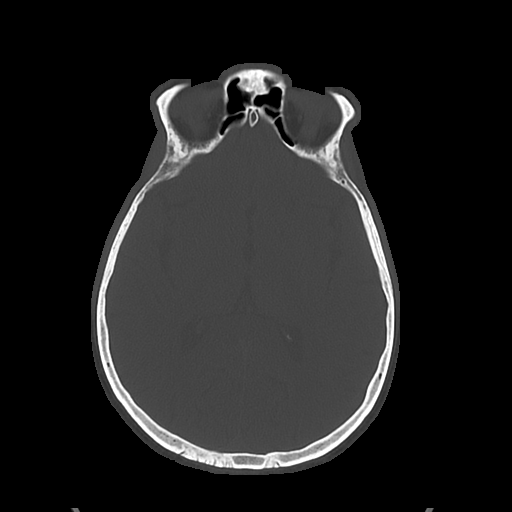

[Series 4: cor soft · coronal · 0.34mm/px · 3 of 68 slices shown]
[im 23/68  brain]
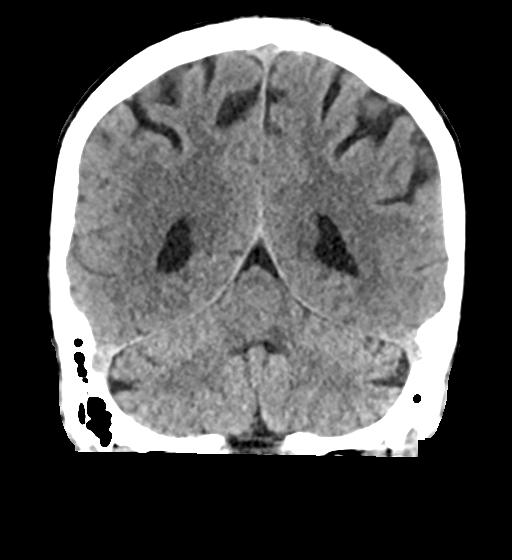
[im 30/68  brain]
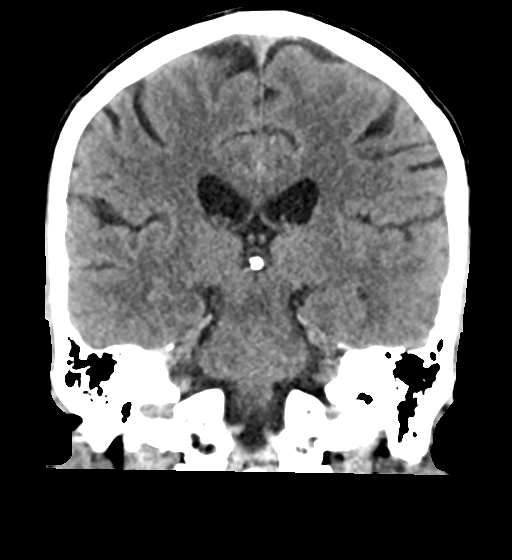
[im 38/68  brain]
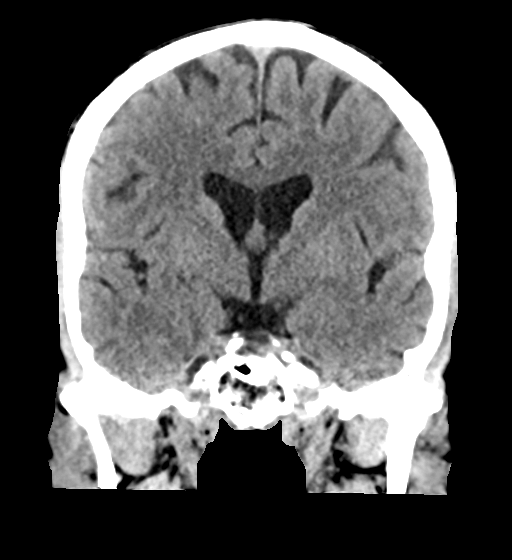

[Series 5: sag soft · sagittal · 0.37mm/px · 3 of 53 slices shown]
[im 18/53  brain]
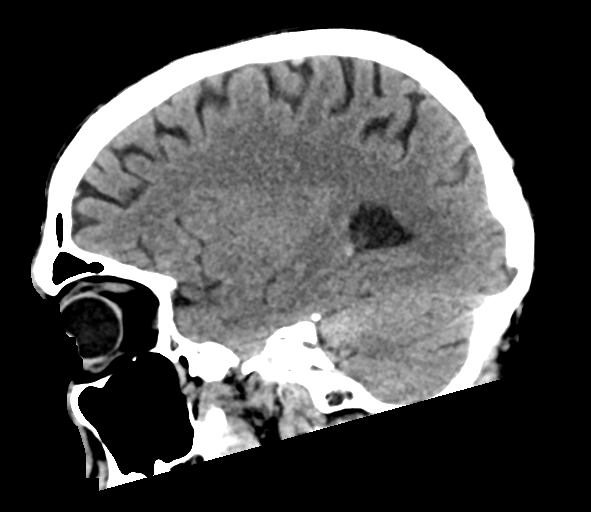
[im 27/53  brain]
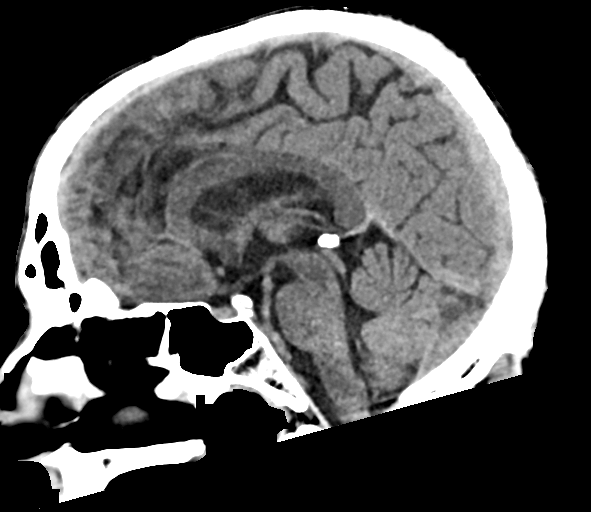
[im 35/53  brain]
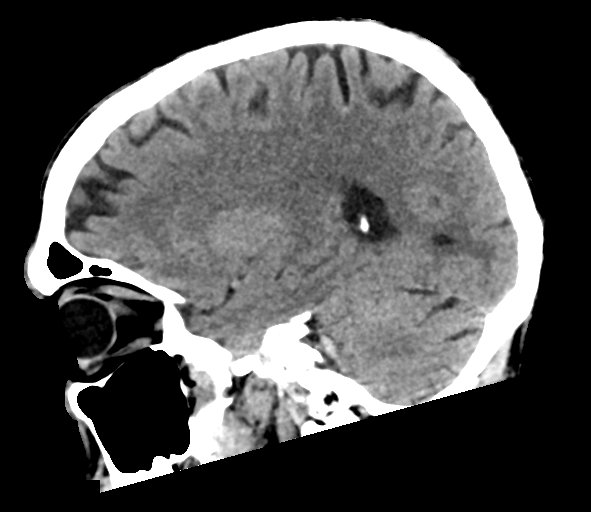

[17 of 47 positions shown; findings below may reference images not displayed]

FINDINGS: Brain: No evidence of acute infarction, hemorrhage, hydrocephalus,
extra-axial collection or mass lesion/mass effect.

Vascular: No hyperdense vessel or unexpected calcification.

Skull: Normal. Negative for fracture or focal lesion.

Sinuses/Orbits: No acute finding.

Other: None.
IMPRESSION: No acute intracranial abnormality noted.

## 2024-01-10 ENCOUNTER — Encounter (HOSPITAL_BASED_OUTPATIENT_CLINIC_OR_DEPARTMENT_OTHER): Payer: Self-pay

## 2024-01-10 NOTE — Progress Notes (Unsigned)
Cardiology Office Note:  .   Date:  01/11/2024 ID:  Brady Alexander, DOB [birth year], MRN [id number] PCP: Geoffry Paradise, MD [facility] Providers Cardiologist:  None   Patient Profile: .      PMH Hyperlipidemia Coronary artery disease CT Calcium score 118 (84th percentile) LM 0, LAD 118, LCx 0, RCA 0 Family history of early CAD Brother died of MI at age 56 (heavy drinker, poor diet, drug use) Father - leaky valve, still alive age 110       History of Present Illness: .   Brady Alexander is a very pleasant 58 y.o. male  who is here today for new patient consult for elevated coronary artery calcium score. He provided copies of extensive lab testing and analysis through his functional medicine doctor. He is especially concerned about his lipid numbers and the coronary calcium because he lives a very healthy lifestyle.  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  Maintains a regular exercise routine, which includes running, swimming, weightlifting and resistance training, and follows a Mediterranean diet. Despite these efforts, his LDL levels have increased compared to the previous year, prompting his primary care physician to order a CT scan. The scan revealed a calcium score of 118 with the bulk of calcium in LAD, placing him at moderate risk for progressive atherosclerosis. He started low dose of rosuvastatin 20mg  once a week per advisement from PCP, Dr. Jacky Kindle, but says he has been taking 1/2 tablet (10 mg) every 3 days. He reports no adverse effects from the medication, but is concerned about its effectiveness given its short half-life and infrequent dosing. 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   ASCVD Risk Score:   RESULT SUMMARY: 4.4 % Risk of cardiovascular event (coronary or stroke death  or non-fatal MI or stroke) in next 10 years.   No statin recommended because 10-year risk <5%; always encourage healthy cardiovascular lifestyle choices. Some patients with other high risk features may still be appropriate for treatment.  INPUTS: History of ASCVD --> 0 = No LDL Cholesterol >=190mg /dL (4.09 mmol/L) --> 0 = No Age --> 57 years Diabetes --> 0 = No Sex --> 1 = Male Total Cholesterol --> 186 mg/dL HDL Cholesterol --> 52 mg/dL Systolic Blood Pressure --> 120 mm Hg Treatment for Hypertension --> 0 = No Smoker --> 0 = No Race --> 1 = White   Diet: Mediterranean diet, very focused now Loves sweets but tries to limit Less fast food than before,  Activity: Runs 3 miles 3-5 x per week - scaling back due to knees Swimming more Weight lifting  Functional mobility exercises   ROS: See HPI       Studies Reviewed: Marland Kitchen   EKG Interpretation Date/Time:  Wednesday January 11 2024 14:54:12 EST Ventricular Rate:  59 PR Interval:  182 QRS Duration:  78 QT Interval:  406 QTC Calculation: 401 R Axis:   66  Text Interpretation: Sinus bradycardia When compared with ECG of 02-May-2022 21:42, No significant change was found No acute ST Abnormality Confirmed by Eligha Bridegroom [phone]) on 01/11/2024 2:58:44 PM       Risk Assessment/Calculations:             Physical Exam:   VS: BP 120/70 (BP Location: Left Arm, Patient Position: Sitting, Cuff Size: Normal)  Pulse (!) 58   Ht 5\' 10"  (1.778 m)   Wt 175 lb (79.4 kg)   BMI 25.11 kg/m   Wt Readings from Last 3 Encounters:  01/11/24 175 lb (79.4 kg)  05/02/22 170 lb (77.1 kg)  11/26/20 178 lb (80.7 kg)     GEN: Well nourished, well developed in no acute distress NECK: No JVD; No carotid bruits CARDIAC: .RRR, no murmurs, rubs, gallops RESPIRATORY:  Clear to auscultation without rales, wheezing or rhonchi  ABDOMEN: Soft, non-tender, non-distended EXTREMITIES:  No edema; No deformity     ASSESSMENT AND PLAN: .     CAD/Coronary artery calcification: CT calcium score of 118 (84th percentile) with all calcification noted in LAD. EKG today reveals sinus bradycardia at 59 bpm, no ST abnormality.  He is currently very active with regular cardiovascular as well as weight and resistance training and denies chest pain, shortness of breath, palpitations, or other symptoms concerning for angina.  We had a lengthy discussion about management of CAD moving forward.  He has a strong family history of coronary disease but admits many of those family members did not take good care of themselves.   Hyperlipidemia LDL goal < 70: Most recent lipid panel 12/29/2023 with total cholesterol 163, HDL 55, triglycerides 58, LDL 94 with additional lipoprotein fractionation via Brady labs LDL particle number 1648 (goal < 1138), LDL small particle 286 (goal < 142), LDL medium 379 (goal < 215) and LDL large 4208 (goal >6729).  LDL-C improved from 119 on 10/14/2023 since starting rosuvastatin 10 mg twice weekly. Family history of heart disease. No current symptoms of chest pain or shortness of breath. Discussed the importance of statin therapy in stabilizing plaque and reducing future risk.  We will increase rosuvastatin to 10 mg daily.  Plan to repeat NMR lipid profile in 2-3 months to assess response to therapy  CV Risk Assessment: ASCVD risk score is 4.4%.  We discussed the components of this as well as the impact at these over time.  He is very active and is not having any concerning cardiac symptoms.  He would like to be aggressive with risk reduction.  He is agreeable to increase statin intensity with rosuvastatin 10 mg once daily.  We will see him back in 2 to 3 months to assess lipid management  Plan/Goals: Continue healthy diet and regular exercise     Dispo: 2-3  months with me  Signed, Eligha Bridegroom, NP-C

## 2024-01-11 ENCOUNTER — Other Ambulatory Visit (HOSPITAL_BASED_OUTPATIENT_CLINIC_OR_DEPARTMENT_OTHER): Payer: Self-pay | Admitting: *Deleted

## 2024-01-11 ENCOUNTER — Encounter (HOSPITAL_BASED_OUTPATIENT_CLINIC_OR_DEPARTMENT_OTHER): Payer: Self-pay | Admitting: Nurse Practitioner

## 2024-01-11 ENCOUNTER — Ambulatory Visit (HOSPITAL_BASED_OUTPATIENT_CLINIC_OR_DEPARTMENT_OTHER): Payer: 59 | Admitting: Nurse Practitioner

## 2024-01-11 VITALS — BP 120/70 | HR 58 | Ht 70.0 in | Wt 175.0 lb

## 2024-01-11 DIAGNOSIS — I779 Disorder of arteries and arterioles, unspecified: Secondary | ICD-10-CM

## 2024-01-11 DIAGNOSIS — I2510 Atherosclerotic heart disease of native coronary artery without angina pectoris: Secondary | ICD-10-CM | POA: Diagnosis not present

## 2024-01-11 DIAGNOSIS — Z7189 Other specified counseling: Secondary | ICD-10-CM

## 2024-01-11 DIAGNOSIS — E785 Hyperlipidemia, unspecified: Secondary | ICD-10-CM

## 2024-01-11 MED ORDER — ROSUVASTATIN CALCIUM 20 MG PO TABS: 20.0000 mg | ORAL_TABLET | Freq: Every day | ORAL | 3 refills | Status: DC

## 2024-01-11 NOTE — Patient Instructions (Signed)
Medication Instructions:   INCREASE Rosuvastatin one (1) tablet by mouth ( 10 mg ) daily.    *If you need a refill on your cardiac medications before your next appointment, please call your pharmacy*   Lab Work:  Your physician recommends that you return for a FASTING NMR/ALT. One week prior to upcoming appointment with Marcelino Duster in April   If you have labs (blood work) drawn today and your tests are completely normal, you will receive your results only by: MyChart Message (if you have MyChart) OR A paper copy in the mail If you have any lab test that is abnormal or we need to change your treatment, we will call you to review the results.   Testing/Procedures:  None ordered.   Follow-Up: At [facility], you and your health needs are our priority.  As part of our continuing mission to provide you with exceptional heart care, we have created designated Provider Care Teams.  These Care Teams include your primary Cardiologist (physician) and Advanced Practice Providers (APPs -  Physician Assistants and Nurse Practitioners) who all work together to provide you with the care you need, when you need it.  We recommend signing up for the patient portal called "MyChart".  Sign up information is provided on this After Visit Summary.  MyChart is used to connect with patients for Virtual Visits (Telemedicine).  Patients are able to view lab/test results, encounter notes, upcoming appointments, etc.  Non-urgent messages can be sent to your provider as well.   To learn more about what you can do with MyChart, go to [email or web address].    Your next appointment:   3 month(s)  Provider:   Eligha Bridegroom, NP

## 2024-01-30 ENCOUNTER — Encounter (HOSPITAL_BASED_OUTPATIENT_CLINIC_OR_DEPARTMENT_OTHER): Payer: Self-pay

## 2024-01-31 NOTE — Telephone Encounter (Signed)
Please advise

## 2024-02-02 NOTE — Telephone Encounter (Signed)
Here ya go!

## 2024-02-16 NOTE — Telephone Encounter (Signed)
Pt follow-up

## 2024-02-17 ENCOUNTER — Other Ambulatory Visit: Payer: Self-pay | Admitting: Nurse Practitioner

## 2024-02-17 DIAGNOSIS — E785 Hyperlipidemia, unspecified: Secondary | ICD-10-CM

## 2024-02-17 DIAGNOSIS — I2510 Atherosclerotic heart disease of native coronary artery without angina pectoris: Secondary | ICD-10-CM

## 2024-02-17 DIAGNOSIS — Z131 Encounter for screening for diabetes mellitus: Secondary | ICD-10-CM

## 2024-02-17 NOTE — Telephone Encounter (Signed)
Pt has follow up questions

## 2024-02-20 NOTE — Telephone Encounter (Signed)
More questions

## 2024-02-23 LAB — ALT: ALT: 24 IU/L (ref 0–44)

## 2024-02-23 LAB — NMR, LIPOPROFILE
Cholesterol, Total: 112 mg/dL (ref 100–199)
HDL Particle Number: 36.6 umol/L (ref >=30.5)
HDL-C: 51 mg/dL (ref >39)
LDL Particle Number: 581 nmol/L (ref <1000)
LDL Size: 20 nm — ABNORMAL LOW (ref >20.5)
LDL-C (NIH Calc): 51 mg/dL (ref 0–99)
LP-IR Score: 33 (ref <=45)
Small LDL Particle Number: 319 nmol/L (ref <=527)
Triglycerides: 39 mg/dL (ref 0–149)

## 2024-02-23 LAB — HEMOGLOBIN A1C
Est. average glucose Bld gHb Est-mCnc: 114 mg/dL
Hgb A1c MFr Bld: 5.6 % (ref 4.8–5.6)

## 2024-02-24 ENCOUNTER — Encounter (HOSPITAL_BASED_OUTPATIENT_CLINIC_OR_DEPARTMENT_OTHER): Payer: Self-pay

## 2024-02-24 NOTE — Telephone Encounter (Signed)
 Sorry just not sure who she wants him to see , this is probably already in your basket!

## 2024-02-26 ENCOUNTER — Encounter (HOSPITAL_BASED_OUTPATIENT_CLINIC_OR_DEPARTMENT_OTHER): Payer: Self-pay

## 2024-02-27 NOTE — Telephone Encounter (Signed)
**Note De-identified  [place] Obfuscation** Please advise 

## 2024-02-27 NOTE — Telephone Encounter (Signed)
 FYI

## 2024-03-12 NOTE — Telephone Encounter (Signed)
 Brady Alexander,   Is he supposed to see anyone other than you? It looks like they scheduled him with an MD..Marland Kitchen

## 2024-03-15 ENCOUNTER — Ambulatory Visit: Payer: Self-pay | Admitting: Internal Medicine

## 2024-03-21 ENCOUNTER — Ambulatory Visit (HOSPITAL_BASED_OUTPATIENT_CLINIC_OR_DEPARTMENT_OTHER): Payer: 59 | Admitting: Nurse Practitioner

## 2024-07-08 ENCOUNTER — Encounter (HOSPITAL_BASED_OUTPATIENT_CLINIC_OR_DEPARTMENT_OTHER): Payer: Self-pay

## 2024-07-19 ENCOUNTER — Ambulatory Visit: Payer: Self-pay | Attending: Internal Medicine | Admitting: Internal Medicine

## 2024-07-19 ENCOUNTER — Encounter: Payer: Self-pay | Admitting: Internal Medicine

## 2024-07-19 VITALS — BP 112/68 | HR 64 | Ht 70.0 in | Wt 165.8 lb

## 2024-07-19 DIAGNOSIS — Z7189 Other specified counseling: Secondary | ICD-10-CM

## 2024-07-19 DIAGNOSIS — E785 Hyperlipidemia, unspecified: Secondary | ICD-10-CM | POA: Diagnosis not present

## 2024-07-19 DIAGNOSIS — I779 Disorder of arteries and arterioles, unspecified: Secondary | ICD-10-CM

## 2024-07-19 DIAGNOSIS — I2510 Atherosclerotic heart disease of native coronary artery without angina pectoris: Secondary | ICD-10-CM | POA: Diagnosis not present

## 2024-07-19 NOTE — Progress Notes (Unsigned)
 Cardiology Office Note:  .   Date:  07/19/2024  ID:  Brady Alexander, DOB October 03, 1966, MRN [id number] PCP: Shepard Ade, MD  [facility] Providers Cardiologist:  Soyla DELENA Merck, MD    History of Present Illness: .   Brady Alexander is a 58 y.o. male.  Discussed the use of AI scribe software for clinical note transcription with the patient, who gave verbal consent to proceed.  History of Present Illness The patient presents with elevated coronary artery calcium  score and family history of cardiovascular disease. Here fore evaluation of elevated coronary artery calcium  score and family history of cardiovascular disease.  He has a significant family history of cardiovascular disease, including a brother who died of a massive heart attack and other relatives with heart disease. He is currently asymptomatic and maintains an active lifestyle, running three miles every other day and engaging in swimming and gym workouts on alternate days.  He is on rosuvastatin  20 mg daily since January 11, 2024, increased from an initial dose of 10 mg daily. His LDL cholesterol decreased from 119 mg/dL in October 2024 to 51 mg/dL in recent tests. Triglycerides are at 39 mg/dL and HDL at 51 mg/dL. He also takes omega-3 fatty acids and CoQ10 supplements, which help with muscle aches associated with statin use.  No chest pain or shortness of breath. He maintains his exercise routine without difficulty.  His diet primarily consists of chicken and [place], with an emphasis on maintaining muscle mass and cardiovascular health. He follows a Mediterranean-style diet and limits red meat intake.    ROS: negative except per HPI above.  Studies Reviewed: SABRA   EKG Interpretation Date/Time:  Thursday July 19 2024 13:27:44 EDT Ventricular Rate:  64 PR Interval:  178 QRS Duration:  84 QT Interval:  398 QTC Calculation: 410 R Axis:   78  Text Interpretation: Normal sinus rhythm Normal ECG When compared with  ECG of 11-Jan-2024 14:54, No significant change was found Confirmed by Merck Soyla ([id number]) on 07/19/2024 2:01:22 PM    Results LABS LDL: 119 (02/22/2024) Triglycerides: 39 (02/22/2024) HDL: 51 (02/22/2024)  DIAGNOSTIC EKG: Normal (07/19/2024) Risk Assessment/Calculations:       Physical Exam:   VS:  BP 112/68   Pulse 64   Ht 5' 10 (1.778 m)   Wt 165 lb 12.8 oz (75.2 kg)   SpO2 98%   BMI 23.79 kg/m    Wt Readings from Last 3 Encounters:  07/19/24 165 lb 12.8 oz (75.2 kg)  01/11/24 175 lb (79.4 kg)  05/02/22 170 lb (77.1 kg)     Physical Exam GENERAL: Alert, cooperative, well developed, no acute distress. HEENT: Normocephalic, normal oropharynx, moist mucous membranes. CHEST: Clear to auscultation bilaterally, no wheezes, rhonchi, or crackles. CARDIOVASCULAR: Normal heart rate and rhythm, S1 and S2 normal without murmurs, strong pulse. ABDOMEN: Soft, non-tender, non-distended, without organomegaly, normal bowel sounds. EXTREMITIES: No cyanosis or edema. NEUROLOGICAL: Cranial nerves grossly intact, moves all extremities without gross motor or sensory deficit.   ASSESSMENT AND PLAN: .    Assessment and Plan Assessment & Plan Atherosclerotic coronary artery disease without angina Cardiac risk couseling Atherosclerotic coronary artery disease with a calcium  score of 118, high for his age but low overall. No symptoms such as chest pain or dyspnea. Current LDL is 51, well-controlled and in the target range for aggressive prevention of future plaque buildup. Discussed potential benefits of coronary CT with contrast to assess soft plaque burden if chest pain. - Continue rosuvastatin  20  mg daily, will consider PCSK9I.  - Maintain regular exercise and healthy lifestyle. - Monitor for symptoms such as chest pain or dyspnea.  Hyperlipidemia Hyperlipidemia with significant improvement in lipid profile. LDL reduced from 119 in October 2024 to 51 in recent testing. Current  therapy includes rosuvastatin  20 mg daily and omega-3 supplements. Discussion on additional cholesterol markers such as lipoprotein A and apolipoprotein B, which are currently used as risk markers without direct treatment available. Potential future treatments for these markers are in development. Discussed the role of supplements like CoQ10 in managing statin side effects such as myalgia. - Continue rosuvastatin  20 mg daily. - ok to continue omega-3 supplements. - Monitor lipid profile regularly. - Await results of upcoming lab tests for additional cholesterol markers.    I spent 40 minutes in the care of Brady Alexander today including reviewing labs (oct 24, jan 25, mar 25), reviewing studies (cal score), face to face time discussing treatment options (31 min), reviewing records from [facility]  (Jan 2025), and documenting in the encounter.   Soyla Merck, MD, FACC

## 2024-07-19 NOTE — Patient Instructions (Addendum)
 Medication Instructions:  No Changes *If you need a refill on your cardiac medications before your next appointment, please call your pharmacy*  Lab Work: None  Follow-Up: At [facility], you and your health needs are our priority.  As part of our continuing mission to provide you with exceptional heart care, our providers are all part of one team.  This team includes your primary Cardiologist (physician) and Advanced Practice Providers or APPs (Physician Assistants and Nurse Practitioners) who all work together to provide you with the care you need, when you need it.  Your next appointment:   1 year(s)  Provider:   Gayatri A Acharya, MD or Callie Goodrich, PA, or Hao Meng, [place] or Damien Braver, NP      Other Instructions Please call us  or send a MyChart message with any Cardiology related questions/concerns.  [phone].  Thank you!

## 2024-07-20 ENCOUNTER — Encounter: Payer: Self-pay | Admitting: Internal Medicine

## 2024-07-20 DIAGNOSIS — Z8249 Family history of ischemic heart disease and other diseases of the circulatory system: Secondary | ICD-10-CM

## 2024-07-20 DIAGNOSIS — I779 Disorder of arteries and arterioles, unspecified: Secondary | ICD-10-CM

## 2024-09-06 ENCOUNTER — Encounter (HOSPITAL_COMMUNITY): Payer: Self-pay

## 2024-09-10 ENCOUNTER — Ambulatory Visit (HOSPITAL_COMMUNITY)
Admission: RE | Admit: 2024-09-10 | Discharge: 2024-09-10 | Disposition: A | Discharge status: Home / Self Care | Source: Ambulatory Visit | Attending: Internal Medicine | Admitting: Internal Medicine

## 2024-09-10 ENCOUNTER — Encounter (HOSPITAL_COMMUNITY): Payer: Self-pay

## 2024-09-10 DIAGNOSIS — I779 Disorder of arteries and arterioles, unspecified: Secondary | ICD-10-CM

## 2024-09-10 DIAGNOSIS — Z8249 Family history of ischemic heart disease and other diseases of the circulatory system: Secondary | ICD-10-CM

## 2024-09-23 ENCOUNTER — Emergency Department
Admission: EM | Admit: 2024-09-23 | Discharge: 2024-09-23 | Disposition: A | Discharge status: Home / Self Care | Attending: Emergency Medicine | Admitting: Emergency Medicine

## 2024-09-23 ENCOUNTER — Emergency Department

## 2024-09-23 ENCOUNTER — Other Ambulatory Visit: Payer: Self-pay

## 2024-09-23 ENCOUNTER — Encounter: Payer: Self-pay | Admitting: Emergency Medicine

## 2024-09-23 DIAGNOSIS — R519 Headache, unspecified: Secondary | ICD-10-CM | POA: Insufficient documentation

## 2024-09-23 LAB — BASIC METABOLIC PANEL WITH GFR
Anion gap: 14 (ref 5–15)
BUN: 28 mg/dL — ABNORMAL HIGH (ref 6–20)
CO2: 21 mmol/L — ABNORMAL LOW (ref 22–32)
Calcium: 9.1 mg/dL (ref 8.9–10.3)
Chloride: 103 mmol/L (ref 98–111)
Creatinine, Ser: 1.25 mg/dL — ABNORMAL HIGH (ref 0.61–1.24)
GFR, Estimated: >60 mL/min (ref >60)
Glucose, Bld: 109 mg/dL — ABNORMAL HIGH (ref 70–99)
Potassium: 4.2 mmol/L (ref 3.5–5.1)
Sodium: 138 mmol/L (ref 135–145)

## 2024-09-23 LAB — CBC
HCT: 43.5 % (ref 39.0–52.0)
Hemoglobin: 14.8 g/dL (ref 13.0–17.0)
MCH: 29 pg (ref 26.0–34.0)
MCHC: 34 g/dL (ref 30.0–36.0)
MCV: 85.3 fL (ref 80.0–100.0)
Platelets: 191 K/uL (ref 150–400)
RBC: 5.1 MIL/uL (ref 4.22–5.81)
RDW: 12.8 % (ref 11.5–15.5)
WBC: 4.8 K/uL (ref 4.0–10.5)
nRBC: 0 % (ref 0.0–0.2)

## 2024-09-23 LAB — SEDIMENTATION RATE: Sed Rate: 4 mm/h (ref 0–20)

## 2024-09-23 LAB — C-REACTIVE PROTEIN: CRP: <0.5 mg/dL (ref <1.0)

## 2024-09-23 MED ORDER — PROCHLORPERAZINE EDISYLATE 10 MG/2ML IJ SOLN
10.0000 mg | Freq: Once | INTRAMUSCULAR | Status: AC
  Administered 2024-09-23: 10 mg via INTRAVENOUS
  Filled 2024-09-23: qty 2

## 2024-09-23 MED ORDER — SODIUM CHLORIDE 0.9 % IV BOLUS
500.0000 mL | Freq: Once | INTRAVENOUS | Status: AC
  Administered 2024-09-23: 500 mL via INTRAVENOUS

## 2024-09-23 MED ORDER — KETOROLAC TROMETHAMINE 15 MG/ML IJ SOLN
15.0000 mg | Freq: Once | INTRAMUSCULAR | Status: AC
Start: 2024-09-23 — End: 2024-09-23
  Administered 2024-09-23: 15 mg via INTRAVENOUS
  Filled 2024-09-23: qty 1

## 2024-09-23 MED ORDER — KETOROLAC TROMETHAMINE 10 MG PO TABS: 10.0000 mg | ORAL_TABLET | Freq: Four times a day (QID) | ORAL | 0 refills | Status: AC | PRN

## 2024-09-23 NOTE — ED Triage Notes (Signed)
 Pt to ED from home c/o right temple pain x3 days and getting worse.  Pain radiates to top of head and down jaw and neck.  States pain is dull in ear and throat, sharp to temple.  Ibuprofen helped.  Denies blurred vision.  Concerned for temporal arteritis after researching symptoms.

## 2024-09-23 NOTE — Discharge Instructions (Addendum)
 You may take Tylenol  up to 4000 mg within 24 hours in addition to the prescribed medication however please do not take any ibuprofen/naproxen/aspirin

## 2024-09-23 NOTE — ED Provider Notes (Signed)
 [facility] Provider Note    Event Date/Time   First MD Initiated Contact with Patient 09/23/24 [phone]     (approximate)   History   Headache   HPI  Brady Alexander is a 58 y.o. male past medical significant for hyperlipidemia, who presents to the emergency department for headache.  Endorses a 2-day history of a headache.  Throbbing pain to his right side of his head that radiates to his jaw and face.  No sudden onset of headache.  Not the worst headache of his life.  States that it has been progressively worsening over the past 2 days.  Took ibuprofen with some improvement.  Denies any change in vision, slurring of speech or trouble swallowing.  Denies any extremity numbness or weakness.  No fever or chills.  No neck stiffness.  No jaw claudication.  No chest pain or shortness of breath.  Denies nausea, vomiting.  No falls or head trauma.     Physical Exam   Triage Vital Signs: ED Triage Vitals  Encounter Vitals Group     BP 09/23/24 0549 (!) 146/94     Girls Systolic BP Percentile --      Girls Diastolic BP Percentile --      Boys Systolic BP Percentile --      Boys Diastolic BP Percentile --      Pulse Rate 09/23/24 0549 (!) 57     Resp 09/23/24 0549 18     Temp 09/23/24 0549 97.9 F (36.6 C)     Temp Source 09/23/24 0549 Oral     SpO2 09/23/24 0549 100 %     Weight 09/23/24 0550 164 lb (74.4 kg)     Height 09/23/24 0550 5' 10 (1.778 m)     Head Circumference --      Peak Flow --      Pain Score 09/23/24 0550 6     Pain Loc --      Pain Education --      Exclude from Growth Chart --     Most recent vital signs: Vitals:   09/23/24 0549  BP: (!) 146/94  Pulse: (!) 57  Resp: 18  Temp: 97.9 F (36.6 C)  SpO2: 100%    Physical Exam Constitutional:      Appearance: He is well-developed.  HENT:     Head: Atraumatic.  Eyes:     Extraocular Movements: Extraocular movements intact.     Conjunctiva/sclera: Conjunctivae normal.     Pupils:  Pupils are equal, round, and reactive to light.  Cardiovascular:     Rate and Rhythm: Regular rhythm.  Pulmonary:     Effort: No respiratory distress.  Musculoskeletal:        General: Normal range of motion.     Cervical back: Normal range of motion and neck supple. No tenderness.     Right lower leg: No edema.     Left lower leg: No edema.  Skin:    General: Skin is warm.     Capillary Refill: Capillary refill takes less than 2 seconds.  Neurological:     Mental Status: He is alert. Mental status is at baseline.     GCS: GCS eye subscore is 4. GCS verbal subscore is 5. GCS motor subscore is 6.     Cranial Nerves: Cranial nerves 2-12 are intact.     Sensory: Sensation is intact.     Motor: Motor function is intact.     Coordination: Coordination  is intact.     Gait: Gait is intact.     IMPRESSION / MDM / ASSESSMENT AND PLAN / ED COURSE  I reviewed the triage vital signs and the nursing notes.  Differential diagnosis including tension headache, cluster headache, migraine headache, malignancy, sinus infection.   No sudden onset of his headache and not the worst headache of his life, have a low suspicion for subarachnoid hemorrhage.  Given new headache will do a CT scan of his head to further evaluate.  No meningismus and have low suspicion for acute meningitis.  No fever or systemic symptoms.     RADIOLOGY I independently reviewed imaging, my interpretation of imaging: CT scan of the head -no findings of intracranial hemorrhage.  No signs of a mass.  Read is currently pending  LABS (all labs ordered are listed, but only abnormal results are displayed) Labs interpreted as -    Labs Reviewed  BASIC METABOLIC PANEL WITH GFR - Abnormal; Notable for the following components:      Result Value   CO2 21 (*)    Glucose, Bld 109 (*)    BUN 28 (*)    Creatinine, Ser 1.25 (*)    All other components within normal limits  CBC  SEDIMENTATION RATE  C-REACTIVE PROTEIN      MDM  Treated with IV medication for headaches with IV Toradol , Compazine and IV fluids.  Plan for CT scan of the head, lab work.  No change in vision and no jaw claudication however does have temporal tenderness and no new headaches will add on ESR and CRP however have a low suspicion for giant cell arteritis.  No change in vision have a low suspicion for acute angle glaucoma.    Clinical Course as of 09/23/24 9281  [place] Sep 23, 2024  0655 New headache, temporal, hx of tension ha. Pending labs, CT head, meds. CTV if HA not improved [EB]    Clinical Course User Index [EB] Bradler, Evan K, MD     PROCEDURES:  Critical Care performed: No  Procedures  Patient's presentation is most consistent with acute presentation with potential threat to life or bodily function.   MEDICATIONS ORDERED IN ED: Medications  ketorolac  (TORADOL ) 15 MG/ML injection 15 mg (15 mg Intravenous Given 09/23/24 0639)  prochlorperazine (COMPAZINE) injection 10 mg (10 mg Intravenous Given 09/23/24 0637)  sodium chloride  0.9 % bolus 500 mL (500 mLs Intravenous New Bag/Given 09/23/24 9362)    FINAL CLINICAL IMPRESSION(S) / ED DIAGNOSES   Final diagnoses:  None     Rx / DC Orders   ED Discharge Orders     None        Note:  This document was prepared using Dragon voice recognition software and may include unintentional dictation errors.   Suzanne Kirsch, MD 09/23/24 [phone]

## 2024-09-23 NOTE — ED Notes (Signed)
 Patient transported to CT

## 2024-11-01 ENCOUNTER — Encounter (HOSPITAL_COMMUNITY): Payer: Self-pay

## 2024-12-07 ENCOUNTER — Other Ambulatory Visit: Payer: Self-pay | Admitting: Medical Genetics

## 2024-12-18 ENCOUNTER — Other Ambulatory Visit: Payer: Self-pay | Admitting: Nurse Practitioner

## 2024-12-19 MED ORDER — ROSUVASTATIN CALCIUM 20 MG PO TABS: 20.0000 mg | ORAL_TABLET | Freq: Every day | ORAL | 3 refills | Status: AC

## 2025-01-31 ENCOUNTER — Other Ambulatory Visit
